# Patient Record
Sex: Female | Born: 1975 | Race: White | Hispanic: No | Marital: Single | Smoking: Never smoker
Health system: Southern US, Community
[De-identification: ages and names within clinical notes are randomized; demographics above are authoritative.]

## PROBLEM LIST (undated history)

## (undated) DIAGNOSIS — R112 Nausea with vomiting, unspecified: Secondary | ICD-10-CM

## (undated) DIAGNOSIS — N390 Urinary tract infection, site not specified: Secondary | ICD-10-CM

## (undated) DIAGNOSIS — F32A Depression, unspecified: Secondary | ICD-10-CM

## (undated) DIAGNOSIS — Z9889 Other specified postprocedural states: Secondary | ICD-10-CM

## (undated) DIAGNOSIS — N200 Calculus of kidney: Secondary | ICD-10-CM

## (undated) DIAGNOSIS — F329 Major depressive disorder, single episode, unspecified: Secondary | ICD-10-CM

## (undated) HISTORY — PX: LIPOSUCTION: SHX10

## (undated) HISTORY — PX: TUBAL LIGATION: SHX77

## (undated) HISTORY — PX: LITHOTRIPSY: SUR834

---

## 2004-04-27 ENCOUNTER — Inpatient Hospital Stay (HOSPITAL_COMMUNITY): Admission: AD | Admit: 2004-04-27 | Discharge: 2004-04-27 | Payer: Self-pay | Admitting: Obstetrics and Gynecology

## 2004-09-08 ENCOUNTER — Emergency Department (HOSPITAL_COMMUNITY): Admission: EM | Admit: 2004-09-08 | Discharge: 2004-09-09 | Payer: Self-pay | Admitting: Emergency Medicine

## 2009-06-04 ENCOUNTER — Emergency Department (HOSPITAL_BASED_OUTPATIENT_CLINIC_OR_DEPARTMENT_OTHER): Admission: EM | Admit: 2009-06-04 | Discharge: 2009-06-04 | Payer: Self-pay | Admitting: Emergency Medicine

## 2009-06-04 ENCOUNTER — Ambulatory Visit: Payer: Self-pay | Admitting: Diagnostic Radiology

## 2011-02-26 LAB — CBC
HCT: 36 % (ref 36.0–46.0)
MCHC: 34.8 g/dL (ref 30.0–36.0)
MCV: 97.1 fL (ref 78.0–100.0)
Platelets: 169 10*3/uL (ref 150–400)
RDW: 12.1 % (ref 11.5–15.5)
WBC: 7.5 10*3/uL (ref 4.0–10.5)

## 2011-02-26 LAB — URINALYSIS, ROUTINE W REFLEX MICROSCOPIC
Glucose, UA: NEGATIVE mg/dL
Ketones, ur: 15 mg/dL — AB
Nitrite: NEGATIVE
Specific Gravity, Urine: 1.023 (ref 1.005–1.030)
Urobilinogen, UA: 0.2 mg/dL (ref 0.0–1.0)

## 2011-02-26 LAB — DIFFERENTIAL
Basophils Absolute: 0 10*3/uL (ref 0.0–0.1)
Eosinophils Relative: 1 % (ref 0–5)
Lymphocytes Relative: 9 % — ABNORMAL LOW (ref 12–46)
Neutro Abs: 6.3 10*3/uL (ref 1.7–7.7)
Neutrophils Relative %: 84 % — ABNORMAL HIGH (ref 43–77)

## 2011-02-26 LAB — COMPREHENSIVE METABOLIC PANEL
AST: 18 U/L (ref 0–37)
BUN: 9 mg/dL (ref 6–23)
GFR calc non Af Amer: >60 mL/min (ref >60)
Potassium: 3.6 mEq/L (ref 3.5–5.1)
Sodium: 142 mEq/L (ref 135–145)

## 2011-02-26 LAB — WET PREP, GENITAL: Yeast Wet Prep HPF POC: NONE SEEN

## 2011-02-26 LAB — PREGNANCY, URINE: Preg Test, Ur: NEGATIVE

## 2011-02-26 LAB — GC/CHLAMYDIA PROBE AMP, GENITAL
Chlamydia, DNA Probe: NEGATIVE
GC Probe Amp, Genital: NEGATIVE

## 2011-05-21 ENCOUNTER — Emergency Department (HOSPITAL_BASED_OUTPATIENT_CLINIC_OR_DEPARTMENT_OTHER)
Admission: EM | Admit: 2011-05-21 | Discharge: 2011-05-21 | Disposition: A | Discharge status: Home / Self Care | Payer: 59 | Attending: Emergency Medicine | Admitting: Emergency Medicine

## 2011-05-21 ENCOUNTER — Emergency Department (INDEPENDENT_AMBULATORY_CARE_PROVIDER_SITE_OTHER): Payer: 59

## 2011-05-21 DIAGNOSIS — R1031 Right lower quadrant pain: Secondary | ICD-10-CM

## 2011-05-21 DIAGNOSIS — R109 Unspecified abdominal pain: Secondary | ICD-10-CM

## 2011-05-21 DIAGNOSIS — N200 Calculus of kidney: Secondary | ICD-10-CM | POA: Insufficient documentation

## 2011-05-21 DIAGNOSIS — R350 Frequency of micturition: Secondary | ICD-10-CM

## 2011-05-21 LAB — BASIC METABOLIC PANEL
Creatinine, Ser: 0.6 mg/dL (ref 0.50–1.10)
GFR calc Af Amer: >60 mL/min (ref >60)
GFR calc non Af Amer: >60 mL/min (ref >60)
Potassium: 3.8 mEq/L (ref 3.5–5.1)

## 2011-05-21 LAB — PREGNANCY, URINE: Preg Test, Ur: NEGATIVE

## 2011-05-21 LAB — URINALYSIS, ROUTINE W REFLEX MICROSCOPIC
Bilirubin Urine: NEGATIVE
Hgb urine dipstick: NEGATIVE
Ketones, ur: NEGATIVE mg/dL
Nitrite: NEGATIVE
Protein, ur: NEGATIVE mg/dL
Specific Gravity, Urine: 1.01 (ref 1.005–1.030)
Urobilinogen, UA: 0.2 mg/dL (ref 0.0–1.0)

## 2011-05-22 ENCOUNTER — Emergency Department (HOSPITAL_BASED_OUTPATIENT_CLINIC_OR_DEPARTMENT_OTHER)
Admission: EM | Admit: 2011-05-22 | Discharge: 2011-05-22 | Disposition: A | Discharge status: Home / Self Care | Payer: 59 | Attending: Emergency Medicine | Admitting: Emergency Medicine

## 2011-05-22 DIAGNOSIS — R109 Unspecified abdominal pain: Secondary | ICD-10-CM | POA: Insufficient documentation

## 2011-05-22 LAB — WET PREP, GENITAL
Clue Cells Wet Prep HPF POC: NONE SEEN
Trich, Wet Prep: NONE SEEN
Yeast Wet Prep HPF POC: NONE SEEN

## 2011-05-23 LAB — GC/CHLAMYDIA PROBE AMP, GENITAL: GC Probe Amp, Genital: NEGATIVE

## 2011-11-07 ENCOUNTER — Emergency Department (INDEPENDENT_AMBULATORY_CARE_PROVIDER_SITE_OTHER): Payer: Medicaid Other

## 2011-11-07 ENCOUNTER — Emergency Department (HOSPITAL_BASED_OUTPATIENT_CLINIC_OR_DEPARTMENT_OTHER)
Admission: EM | Admit: 2011-11-07 | Discharge: 2011-11-07 | Disposition: A | Discharge status: Home / Self Care | Payer: Medicaid Other | Attending: Emergency Medicine | Admitting: Emergency Medicine

## 2011-11-07 ENCOUNTER — Encounter: Payer: Self-pay | Admitting: *Deleted

## 2011-11-07 DIAGNOSIS — R112 Nausea with vomiting, unspecified: Secondary | ICD-10-CM | POA: Insufficient documentation

## 2011-11-07 DIAGNOSIS — R1031 Right lower quadrant pain: Secondary | ICD-10-CM | POA: Insufficient documentation

## 2011-11-07 DIAGNOSIS — N83209 Unspecified ovarian cyst, unspecified side: Secondary | ICD-10-CM | POA: Insufficient documentation

## 2011-11-07 DIAGNOSIS — N83201 Unspecified ovarian cyst, right side: Secondary | ICD-10-CM

## 2011-11-07 HISTORY — DX: Urinary tract infection, site not specified: N39.0

## 2011-11-07 HISTORY — DX: Depression, unspecified: F32.A

## 2011-11-07 HISTORY — DX: Calculus of kidney: N20.0

## 2011-11-07 HISTORY — DX: Major depressive disorder, single episode, unspecified: F32.9

## 2011-11-07 LAB — COMPREHENSIVE METABOLIC PANEL
BUN: 13 mg/dL (ref 6–23)
CO2: 25 mEq/L (ref 19–32)
Chloride: 108 mEq/L (ref 96–112)
Potassium: 3.9 mEq/L (ref 3.5–5.1)
Sodium: 141 mEq/L (ref 135–145)
Total Protein: 6.4 g/dL (ref 6.0–8.3)

## 2011-11-07 LAB — URINALYSIS, ROUTINE W REFLEX MICROSCOPIC
Glucose, UA: NEGATIVE mg/dL
Ketones, ur: 40 mg/dL — AB
Urobilinogen, UA: 0.2 mg/dL (ref 0.0–1.0)
pH: 7 (ref 5.0–8.0)

## 2011-11-07 LAB — PREGNANCY, URINE: Preg Test, Ur: NEGATIVE

## 2011-11-07 LAB — CBC
HCT: 35.8 % — ABNORMAL LOW (ref 36.0–46.0)
MCH: 33.4 pg (ref 26.0–34.0)
MCHC: 36 g/dL (ref 30.0–36.0)
Platelets: 148 10*3/uL — ABNORMAL LOW (ref 150–400)
RDW: 11.5 % (ref 11.5–15.5)

## 2011-11-07 MED ORDER — ONDANSETRON 8 MG PO TBDP
ORAL_TABLET | ORAL | Status: AC
  Filled 2011-11-07: qty 1

## 2011-11-07 MED ORDER — IOHEXOL 300 MG/ML  SOLN
100.0000 mL | Freq: Once | INTRAMUSCULAR | Status: AC | PRN
  Administered 2011-11-07: 100 mL via INTRAVENOUS

## 2011-11-07 MED ORDER — ONDANSETRON HCL 4 MG/2ML IJ SOLN
4.0000 mg | Freq: Once | INTRAMUSCULAR | Status: AC
  Administered 2011-11-07: 4 mg via INTRAVENOUS
  Filled 2011-11-07: qty 2

## 2011-11-07 MED ORDER — MORPHINE SULFATE 4 MG/ML IJ SOLN
4.0000 mg | Freq: Once | INTRAMUSCULAR | Status: AC
  Administered 2011-11-07: 4 mg via INTRAVENOUS
  Filled 2011-11-07: qty 1

## 2011-11-07 MED ORDER — ONDANSETRON 8 MG PO TBDP: 8.0000 mg | ORAL_TABLET | Freq: Three times a day (TID) | ORAL | Status: AC | PRN

## 2011-11-07 MED ORDER — MORPHINE SULFATE 4 MG/ML IJ SOLN
6.0000 mg | Freq: Once | INTRAMUSCULAR | Status: AC
  Administered 2011-11-07: 4 mg via INTRAVENOUS
  Filled 2011-11-07: qty 1

## 2011-11-07 MED ORDER — ONDANSETRON 8 MG PO TBDP
8.0000 mg | ORAL_TABLET | Freq: Once | ORAL | Status: AC
  Administered 2011-11-07: 8 mg via ORAL

## 2011-11-07 MED ORDER — MORPHINE SULFATE 2 MG/ML IJ SOLN
INTRAMUSCULAR | Status: AC
  Administered 2011-11-07: 6 mg via INTRAVENOUS
  Filled 2011-11-07: qty 1

## 2011-11-07 MED ORDER — HYDROCODONE-ACETAMINOPHEN 5-500 MG PO TABS: 1.0000 | ORAL_TABLET | Freq: Four times a day (QID) | ORAL | Status: AC | PRN

## 2011-11-07 MED ORDER — IBUPROFEN 600 MG PO TABS: 600.0000 mg | ORAL_TABLET | Freq: Three times a day (TID) | ORAL | Status: AC | PRN

## 2011-11-07 MED ORDER — SODIUM CHLORIDE 0.9 % IV BOLUS (SEPSIS)
1000.0000 mL | Freq: Once | INTRAVENOUS | Status: AC
  Administered 2011-11-07: 1000 mL via INTRAVENOUS

## 2011-11-07 NOTE — ED Provider Notes (Signed)
History     CSN: [id number] Arrival date & time: 11/07/2011 11:20 AM   First MD Initiated Contact with Patient 11/07/11 1119      Chief Complaint  Patient presents with  . Abdominal Pain    RLQ and right lower back pain    HPI Patient reports approximately 3 days of right-sided abdominal pain with nausea no vomiting.  She denies diarrhea.  She took a laxative last night without improvement in her symptoms.  However she has not had significant constipation.  She reports she's had these symptoms on and off for several years has been evaluated for this with both ultrasound as well as CT scans without a definitive cause.  She has seen a gastroenterologist for this as well and had sigmoidoscopy x2 without colonoscopy or endoscopy.  No definitive diagnosis has been made to this point.  She reports presenting today because of worsening right-sided pain.  She's had a tubal ligation.  She reports a recent ultrasound demonstrating no ovarian cysts several weeks ago.  Her OB/GYN is also aware of her symptoms and has not made a diagnosis of this point.  She's had no fever or chills.  Nothing worsens her symptoms.  Nothing improves her symptoms.  Her symptoms are constant and she reports radiation to the right flank some.  She does have a prior history of kidney stones without ureterolithiasis.   Past Medical History  Diagnosis Date  . Kidney stones   . Urinary tract infection     chronic   . Depression     Past Surgical History  Procedure Date  . Cesarean section   . Liposuction     with fat transfer  . Tubal ligation     No family history on file.  History  Substance Use Topics  . Smoking status: Not on file  . Smokeless tobacco: Not on file  . Alcohol Use: No    OB History    Grav Para Term Preterm Abortions TAB SAB Ect Mult Living                  Review of Systems  All other systems reviewed and are negative.    Allergies  Review of patient's allergies indicates no  known allergies.  Home Medications   Current Outpatient Rx  Name Route Sig Dispense Refill  . DOXYCYCLINE (ROSACEA) 40 MG PO CPDR Oral Take 40 mg by mouth every morning.      Marland Kitchen NAPROXEN 500 MG PO TABS Oral Take 500 mg by mouth 2 (two) times daily with a meal.      . MEDROXYPROGESTERONE ACETATE 10 MG PO TABS Oral Take 10 mg by mouth daily.        BP 123/78  Pulse 74  Temp(Src) 98.7 F (37.1 C) (Oral)  Resp 20  Ht 5\' 7"  (1.702 m)  Wt 140 lb (63.504 kg)  BMI 21.93 kg/m2  SpO2 100%  LMP 09/27/2011  Physical Exam  Nursing note and vitals reviewed. Constitutional: She is oriented to person, place, and time. She appears well-developed and well-nourished. No distress.  HENT:  Head: Normocephalic and atraumatic.  Eyes: EOM are normal.  Neck: Normal range of motion.  Cardiovascular: Normal rate, regular rhythm and normal heart sounds.   Pulmonary/Chest: Effort normal and breath sounds normal.  Abdominal: Soft. She exhibits no distension.       Mild right-sided tenderness without focal pain.  No pain in her right upper quadrant.  Negative Murphy's sign.  No tenderness  at McBurney's point  Genitourinary:       No CVA tenderness  Musculoskeletal: Normal range of motion.  Neurological: She is alert and oriented to person, place, and time.  Skin: Skin is warm and dry.  Psychiatric: She has a normal mood and affect. Judgment normal.    ED Course  Procedures (including critical care time)  Labs Reviewed  URINALYSIS, ROUTINE W REFLEX MICROSCOPIC - Abnormal; Notable for the following:    Ketones, ur 40 (*)    All other components within normal limits  CBC - Abnormal; Notable for the following:    WBC 3.6 (*)    RBC 3.86 (*)    HCT 35.8 (*)    Platelets 148 (*)    All other components within normal limits  COMPREHENSIVE METABOLIC PANEL - Abnormal; Notable for the following:    Alkaline Phosphatase 31 (*)    All other components within normal limits  PREGNANCY, URINE   Ct  Abdomen Pelvis W Contrast  11/07/2011  *RADIOLOGY REPORT*  Clinical Data: Right lower abdominal pain, back pain, nausea and vomiting.  CT ABDOMEN AND PELVIS WITH CONTRAST  Technique:  Multidetector CT imaging of the abdomen and pelvis was performed following the standard protocol during bolus administration of intravenous contrast.  Contrast: OMNIPAQUE IOHEXOL 300 MG/ML IV SOLN  Comparison: 05/21/2011 and 06/04/2009.  Findings: Large right adnexal cyst present not identified on prior studies and measuring approximately 4.5 cm in greatest diameter. This is likely a physiologic cyst.  Internal density consistent with simple fluid.  Trace free fluid present in the pelvis.  The appendix is normal and there is no evidence of acute appendicitis.  Other bowel loops in the abdomen and pelvis are unremarkable.  The liver, gallbladder, pancreas, spleen, adrenal glands and kidneys are within normal limits.  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  No hernias or abnormal calcifications.  Bony structures are unremarkable.  IMPRESSION: 4.5 cm right adnexal cyst.  Original Report Authenticated By: Reola Calkins, M.D.   I personally evaluated the CT scan  1. Abdominal pain   2. Right ovarian cyst       MDM  Will treat pain and give fluids at this time.  Pain medicine and antiemetics.  The patient has had several negative ultrasounds and CT scans.     1:12 PM The patient continues to have right-sided tenderness and appears to have a little bit more focal tenderness in the right lower quadrant.  CT scan pending at this time.  She reports her pain is improved after morphine.  Her last CT scan did demonstrate some mild swelling of her right ureter without ureterolithiasis.  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  2:29 PM The patient feels much better at this time.  She was discharged home with  anti-inflammatories and Vicodin pain medicine.  Her pain is likely related to right ovarian cyst in the she will be referred to OB/GYN for additional followup.  The patient understands and agreeable to outpatient plan       Lyanne Co, MD 11/07/11 1429

## 2011-11-07 NOTE — ED Notes (Signed)
Patient states she has had rlq abdominal pain and right lower back pain for the last 3 days.  States she has had the same symptoms off and on for the last year.  Was seen here for the same in July.  States pain started in her rlq and radiates into right lower back.  Denies urinary symptoms or vaginal drainage.  States she took a laxative last night to clean herself out with good results, states she was having regular bm's prior to the laxative.

## 2012-03-04 ENCOUNTER — Telehealth: Payer: Self-pay | Admitting: Obstetrics and Gynecology

## 2012-03-04 NOTE — Telephone Encounter (Signed)
B/c refill req/linda/ep

## 2012-03-05 NOTE — Telephone Encounter (Signed)
Tc to pt wanting rx for bc safyral. Informed pt that I will [facility] per ep and if she has any problems to give [place] a call back

## 2012-03-05 NOTE — Telephone Encounter (Signed)
Call in Safyral #1 1 po qd with 11 ref [facility] [address] per ep

## 2014-01-20 ENCOUNTER — Other Ambulatory Visit: Payer: Self-pay | Admitting: Family Medicine

## 2014-01-20 DIAGNOSIS — R1011 Right upper quadrant pain: Secondary | ICD-10-CM

## 2014-01-29 ENCOUNTER — Ambulatory Visit
Admission: RE | Admit: 2014-01-29 | Discharge: 2014-01-29 | Disposition: A | Discharge status: Home / Self Care | Payer: BC Managed Care – PPO | Source: Ambulatory Visit | Attending: Family Medicine | Admitting: Family Medicine

## 2014-01-29 DIAGNOSIS — R1011 Right upper quadrant pain: Secondary | ICD-10-CM

## 2014-02-25 ENCOUNTER — Other Ambulatory Visit: Payer: Self-pay | Admitting: Urology

## 2014-02-27 ENCOUNTER — Encounter (HOSPITAL_COMMUNITY): Payer: Self-pay | Admitting: *Deleted

## 2014-03-03 ENCOUNTER — Encounter (HOSPITAL_COMMUNITY): Payer: Self-pay | Admitting: Pharmacy Technician

## 2014-03-09 ENCOUNTER — Encounter (HOSPITAL_COMMUNITY): Payer: Self-pay | Admitting: General Practice

## 2014-03-09 ENCOUNTER — Ambulatory Visit (HOSPITAL_COMMUNITY): Payer: BC Managed Care – PPO

## 2014-03-09 ENCOUNTER — Encounter (HOSPITAL_COMMUNITY)
Admission: RE | Disposition: A | Discharge status: Home / Self Care | Payer: Self-pay | Source: Ambulatory Visit | Attending: Urology

## 2014-03-09 ENCOUNTER — Ambulatory Visit (HOSPITAL_COMMUNITY)
Admission: RE | Admit: 2014-03-09 | Discharge: 2014-03-09 | Disposition: A | Discharge status: Home / Self Care | Payer: BC Managed Care – PPO | Source: Ambulatory Visit | Attending: Urology | Admitting: Urology

## 2014-03-09 DIAGNOSIS — N200 Calculus of kidney: Secondary | ICD-10-CM | POA: Insufficient documentation

## 2014-03-09 DIAGNOSIS — Z79899 Other long term (current) drug therapy: Secondary | ICD-10-CM | POA: Insufficient documentation

## 2014-03-09 DIAGNOSIS — Z791 Long term (current) use of non-steroidal anti-inflammatories (NSAID): Secondary | ICD-10-CM | POA: Insufficient documentation

## 2014-03-09 SURGERY — LITHOTRIPSY, ESWL
Anesthesia: LOCAL | Laterality: Right

## 2014-03-09 MED ORDER — DIAZEPAM 5 MG PO TABS
10.0000 mg | ORAL_TABLET | ORAL | Status: AC
  Administered 2014-03-09: 10 mg via ORAL
  Filled 2014-03-09: qty 2

## 2014-03-09 MED ORDER — TAMSULOSIN HCL 0.4 MG PO CAPS: 0.4000 mg | ORAL_CAPSULE | Freq: Every day | ORAL | Status: DC

## 2014-03-09 MED ORDER — CIPROFLOXACIN HCL 500 MG PO TABS: 500.0000 mg | ORAL_TABLET | Freq: Two times a day (BID) | ORAL | Status: DC

## 2014-03-09 MED ORDER — CIPROFLOXACIN HCL 500 MG PO TABS
500.0000 mg | ORAL_TABLET | ORAL | Status: AC
  Administered 2014-03-09: 500 mg via ORAL
  Filled 2014-03-09: qty 1

## 2014-03-09 MED ORDER — DIPHENHYDRAMINE HCL 25 MG PO CAPS
25.0000 mg | ORAL_CAPSULE | ORAL | Status: AC
  Administered 2014-03-09: 25 mg via ORAL
  Filled 2014-03-09: qty 1

## 2014-03-09 MED ORDER — SODIUM CHLORIDE 0.9 % IV SOLN
INTRAVENOUS | Status: DC
  Administered 2014-03-09: 11:00:00 via INTRAVENOUS

## 2014-03-09 MED ORDER — IBUPROFEN 200 MG PO TABS: 800.0000 mg | ORAL_TABLET | Freq: Four times a day (QID) | ORAL | Status: DC | PRN

## 2014-03-09 MED ORDER — OXYCODONE-ACETAMINOPHEN 5-325 MG PO TABS: 1.0000 | ORAL_TABLET | ORAL | Status: DC | PRN

## 2014-03-09 MED ORDER — DICLOFENAC SODIUM 75 MG PO TBEC: 75.0000 mg | DELAYED_RELEASE_TABLET | Freq: Two times a day (BID) | ORAL | Status: DC | PRN

## 2014-03-09 NOTE — H&P (Signed)
History of Present Illness Consult for nephrolithiasis referred by Dr. Mila PalmerSharon Kerr.     1- nephrolithiasis - Mar 2015 - pt developed right flank pain radiating down into the right lower quadrant about 3 months ago. She thought it was her "ovary". Pain is mild to moderate and intermittent. She hasn't had a lot of pain over the past few days. A Mar 2015 Abd U/s showed a right 9mm upj stone and a 5 mm LMP stone. I reviewed all the images. She also had a CT scan December 2012 which showed a 9 mm right lower pole stone. I could see the stone on the scout. I did not appreciate the left renal stone. Again all images reviewed. Bun 13, cr 0.73, UA clear.   She is known about the stones but has had no prior stone passage or stone treatment.    Past Medical History Problems  1. History of Anxiety (300.00) 2. History of renal calculi (V13.01)  Surgical History Problems  1. History of Cesarean Section 2. History of Liposuction 3. History of Tubal Ligation  Current Meds 1. Diclofenac Sodium 75 MG Oral Tablet Delayed Release;  Therapy: (Recorded:25Mar2015) to Recorded 2. Probiotic CAPS;  Therapy: (Recorded:25Mar2015) to Recorded 3. Zyrtec TABS;  Therapy: (Recorded:25Mar2015) to Recorded  Allergies Medication  1. No Known Drug Allergies  Social History Problems    Daily caffeine consumption, 1 serving a day   Minimum alcohol consumption   Never smoker   Single   Three children  Review of Systems Genitourinary, constitutional, skin, eye, otolaryngeal, hematologic/lymphatic, cardiovascular, pulmonary, endocrine, musculoskeletal, gastrointestinal, neurological and psychiatric system(s) were reviewed and pertinent findings if present are noted.  Gastrointestinal: nausea, heartburn, diarrhea and constipation.  Constitutional: fever, night sweats, feeling tired (fatigue) and recent weight loss.  Hematologic/Lymphatic: a tendency to easily bruise.  Psychiatric: anxiety.     Vitals Vital Signs [Data Includes: Last 1 Day]  Recorded: 25Mar2015 01:24PM  Height: 5 ft 7 in Weight: 143 lb  BMI Calculated: 22.4 BSA Calculated: 1.75 Blood Pressure: 104 / 69 Temperature: 98 F Heart Rate: 56  Physical Exam Constitutional: Well nourished and well developed . No acute distress.  Pulmonary: No respiratory distress and normal respiratory rhythm and effort.  Cardiovascular: Heart rate and rhythm are normal . No peripheral edema.  Abdomen: No CVA tenderness.  Neuro/Psych:. Mood and affect are appropriate.    Results/Data Urine [Data Includes: Last 1 Day]   25Mar2015  COLOR YELLOW   APPEARANCE CLOUDY   SPECIFIC GRAVITY 1.020   pH 6.5   GLUCOSE NEG mg/dL  BILIRUBIN NEG   KETONE NEG mg/dL  BLOOD NEG   PROTEIN NEG mg/dL  UROBILINOGEN 0.2 mg/dL  NITRITE NEG   LEUKOCYTE ESTERASE NEG   SQUAMOUS EPITHELIAL/HPF FEW   WBC NONE SEEN WBC/hpf  RBC NONE SEEN RBC/hpf  BACTERIA NONE SEEN   CRYSTALS NONE SEEN   CASTS NONE SEEN   Other AMORPHOUS NOTED    Procedure KUB - stable 9 mm right lower pole stone. Stable pelvic phlebolith on the right. I did not appreciate the left stone. Normal glans and bowel gas pattern. The colon was obscuring the left kidney.     Assessment Assessed  1. Nephrolithiasis (592.0)  Plan Health Maintenance  1. UA With REFLEX; [Do Not Release]; Status:Complete;   Done: 25Mar2015 01:15PM Nephrolithiasis  2. Follow-up Schedule Surgery Office  Follow-up  Status: Hold For - Appointment   Requested for: 25Mar2015 3. KUB; Status:Resulted - Requires Verification;   Done: 25Mar2015 12:00AM  Discussion/Summary I discussed the CT and KUB findings with the patient. Whenever the nature risks and benefits of continued surveillance, RIGHT shockwave lithotripsy, or ureteroscopy. All questions answered. She would like to proceed with shockwave lithotripsy and we discussed risks such as bleeding and infection failure to fragment failure to pass among  others. We discussed she may need a staged procedure to clear this stone.      Signatures Electronically signed by : Jerilee FieldMatthew Deshan Hemmelgarn, M.D.; Feb 11 2014  2:40PM EST

## 2014-03-09 NOTE — Op Note (Signed)
See [facility] op note

## 2014-03-09 NOTE — Discharge Instructions (Signed)
Lithotripsy, Care After °Refer to this sheet in the next few weeks. These instructions provide you with information on caring for yourself after your procedure. Your health care provider may also give you more specific instructions. Your treatment has been planned according to current medical practices, but problems sometimes occur. Call your health care provider if you have any problems or questions after your procedure. °WHAT TO EXPECT AFTER THE PROCEDURE  °· Your urine may have a red tinge for a few days after treatment. Blood loss is usually minimal. °· You may have soreness in the back or flank area. This usually goes away after a few days. The procedure can cause blotches or bruises on the back where the pressure wave enters the skin. These marks usually cause only minimal discomfort and should disappear in a short time. °· Stone fragments should begin to pass within 24 hours of treatment. However, a delayed passage is not unusual. °· You may have pain, discomfort, and feel sick to your stomach (nauseated) when the crushed fragments of stone are passed down the tube from the kidney to the bladder. Stone fragments can pass soon after the procedure and may last for up to 4 8 weeks. °· A small number of patients may have severe pain when stone fragments are not able to pass, which leads to an obstruction. °· If your stone is greater than 1 inch (2.5 cm) in diameter or if you have multiple stones that have a combined diameter greater than 1 inch (2.5 cm), you may require more than one treatment. °· If you had a stent placed prior to your procedure, you may experience some discomfort, especially during urination. You may experience the pain or discomfort in your flank or back, or you may experience a sharp pain or discomfort at the base of your penis or in your lower abdomen. The discomfort usually lasts only a few minutes after urinating. °HOME CARE INSTRUCTIONS  °· Rest at home until you feel your energy  improving. °· Only take over-the-counter or prescription medicines for pain, discomfort, or fever as directed by your health care provider. Depending on the type of lithotripsy, you may need to take antibiotics and anti-inflammatory medicines for a few days. °· Drink enough water and fluids to keep your urine clear or pale yellow. This helps "flush" your kidneys. It helps pass any remaining pieces of stone and prevents stones from coming back. °· Most people can resume daily activities within 1 2 days after standard lithotripsy. It can take longer to recover from laser and percutaneous lithotripsy. °· If the stones are in your urinary system, you may be asked to strain your urine at home to look for stones. Any stones that are found can be sent to a medical lab for examination. °· Visit your health care provider for a follow-up appointment in a few weeks. Your doctor may remove your stent if you have one. Your health care provider will also check to see whether stone particles still remain. °SEEK MEDICAL CARE IF:  °· Your pain is not relieved by medicine. °· You have a lasting nauseous feeling. °· You feel there is too much blood in the urine. °· You develop persistent problems with frequent or painful urination that does not at least partially improve after 2 days following the procedure. °· You have a congested cough. °· You feel lightheaded. °· You develop a rash or any other signs that might suggest an allergic problem. °· You develop any reaction or side   effects to your medicine(s). °SEEK IMMEDIATE MEDICAL CARE IF:  °· You experience severe back or flank pain or both. °· You see nothing but blood when you urinate. °· You cannot pass any urine at all. °· You have a fever or shaking chills. °· You develop shortness of breath, difficulty breathing, or chest pain. °· You develop vomiting that will not stop after 6 8 hours. °· You have a fainting episode. °Document Released: 11/26/2007 Document Revised: 08/27/2013  Document Reviewed: 05/22/2013 °[employer]® Patient Information ©2014 [employer]. ° °

## 2014-03-09 NOTE — Interval H&P Note (Signed)
History and Physical Interval Note:  03/09/2014 12:48 PM  Evelyn KluverKimberly A Kerr  has presented today for surgery, with the diagnosis of Right Renal Stone  The various methods of treatment have been discussed with the patient and family. After consideration of risks, benefits and other options for treatment, the patient has consented to  Procedure(s): RIGHT EXTRACORPOREAL SHOCK WAVE LITHOTRIPSY (ESWL) (Right) as a surgical intervention .  The patient's history has been reviewed, patient examined, no change in status, stable for surgery.  I have reviewed the patient's chart and labs.  Questions were answered to the patient's satisfaction.  She hasn't passed a stone. No dysuria or fever.    Lowella PettiesMatthew Ramsey Taffy Delconte

## 2016-02-25 DIAGNOSIS — F4310 Post-traumatic stress disorder, unspecified: Secondary | ICD-10-CM | POA: Diagnosis not present

## 2016-03-15 DIAGNOSIS — F4310 Post-traumatic stress disorder, unspecified: Secondary | ICD-10-CM | POA: Diagnosis not present

## 2016-03-24 DIAGNOSIS — F4310 Post-traumatic stress disorder, unspecified: Secondary | ICD-10-CM | POA: Diagnosis not present

## 2016-04-11 DIAGNOSIS — F4310 Post-traumatic stress disorder, unspecified: Secondary | ICD-10-CM | POA: Diagnosis not present

## 2016-04-21 DIAGNOSIS — F4310 Post-traumatic stress disorder, unspecified: Secondary | ICD-10-CM | POA: Diagnosis not present

## 2016-05-08 DIAGNOSIS — L509 Urticaria, unspecified: Secondary | ICD-10-CM | POA: Diagnosis not present

## 2016-05-19 DIAGNOSIS — F4310 Post-traumatic stress disorder, unspecified: Secondary | ICD-10-CM | POA: Diagnosis not present

## 2016-05-26 DIAGNOSIS — D72819 Decreased white blood cell count, unspecified: Secondary | ICD-10-CM | POA: Diagnosis not present

## 2016-06-02 ENCOUNTER — Telehealth: Payer: Self-pay | Admitting: Hematology and Oncology

## 2016-06-02 ENCOUNTER — Encounter: Payer: Self-pay | Admitting: Hematology and Oncology

## 2016-06-02 DIAGNOSIS — F4310 Post-traumatic stress disorder, unspecified: Secondary | ICD-10-CM | POA: Diagnosis not present

## 2016-06-02 NOTE — Telephone Encounter (Signed)
Appointment with Evelyn Kerr on 8/1 at 11:30am. Aware to arrive 30 minutes early. Demographics verified, Insurance will change on 8/1. Location given. Letter to referring.

## 2016-06-08 DIAGNOSIS — Z113 Encounter for screening for infections with a predominantly sexual mode of transmission: Secondary | ICD-10-CM | POA: Diagnosis not present

## 2016-06-08 DIAGNOSIS — D729 Disorder of white blood cells, unspecified: Secondary | ICD-10-CM | POA: Diagnosis not present

## 2016-06-16 DIAGNOSIS — F4310 Post-traumatic stress disorder, unspecified: Secondary | ICD-10-CM | POA: Diagnosis not present

## 2016-06-20 ENCOUNTER — Ambulatory Visit: Payer: BLUE CROSS/BLUE SHIELD | Admitting: Hematology and Oncology

## 2016-07-14 DIAGNOSIS — F4310 Post-traumatic stress disorder, unspecified: Secondary | ICD-10-CM | POA: Diagnosis not present

## 2016-07-28 DIAGNOSIS — F4310 Post-traumatic stress disorder, unspecified: Secondary | ICD-10-CM | POA: Diagnosis not present

## 2016-08-11 DIAGNOSIS — F4310 Post-traumatic stress disorder, unspecified: Secondary | ICD-10-CM | POA: Diagnosis not present

## 2016-08-28 DIAGNOSIS — F4310 Post-traumatic stress disorder, unspecified: Secondary | ICD-10-CM | POA: Diagnosis not present

## 2016-08-31 ENCOUNTER — Emergency Department (HOSPITAL_COMMUNITY)
Admission: EM | Admit: 2016-08-31 | Discharge: 2016-08-31 | Disposition: A | Discharge status: Home / Self Care | Payer: BLUE CROSS/BLUE SHIELD | Attending: Emergency Medicine | Admitting: Emergency Medicine

## 2016-08-31 ENCOUNTER — Emergency Department (HOSPITAL_COMMUNITY): Payer: BLUE CROSS/BLUE SHIELD

## 2016-08-31 ENCOUNTER — Encounter (HOSPITAL_COMMUNITY): Payer: Self-pay | Admitting: Family Medicine

## 2016-08-31 DIAGNOSIS — R102 Pelvic and perineal pain: Secondary | ICD-10-CM | POA: Insufficient documentation

## 2016-08-31 DIAGNOSIS — N83201 Unspecified ovarian cyst, right side: Secondary | ICD-10-CM | POA: Diagnosis not present

## 2016-08-31 DIAGNOSIS — R1031 Right lower quadrant pain: Secondary | ICD-10-CM | POA: Diagnosis not present

## 2016-08-31 DIAGNOSIS — N83209 Unspecified ovarian cyst, unspecified side: Secondary | ICD-10-CM

## 2016-08-31 DIAGNOSIS — N83291 Other ovarian cyst, right side: Secondary | ICD-10-CM | POA: Diagnosis not present

## 2016-08-31 DIAGNOSIS — N83202 Unspecified ovarian cyst, left side: Secondary | ICD-10-CM | POA: Diagnosis not present

## 2016-08-31 HISTORY — DX: Other specified postprocedural states: Z98.890

## 2016-08-31 HISTORY — DX: Nausea with vomiting, unspecified: R11.2

## 2016-08-31 LAB — WET PREP, GENITAL
CLUE CELLS WET PREP: NONE SEEN
SPERM: NONE SEEN
TRICH WET PREP: NONE SEEN
YEAST WET PREP: NONE SEEN

## 2016-08-31 LAB — COMPREHENSIVE METABOLIC PANEL
ALBUMIN: 4.3 g/dL (ref 3.5–5.0)
ALT: 24 U/L (ref 14–54)
ANION GAP: 7 (ref 5–15)
AST: 54 U/L — AB (ref 15–41)
Alkaline Phosphatase: 47 U/L (ref 38–126)
BILIRUBIN TOTAL: 1.2 mg/dL (ref 0.3–1.2)
BUN: 10 mg/dL (ref 6–20)
CHLORIDE: 106 mmol/L (ref 101–111)
CO2: 26 mmol/L (ref 22–32)
Calcium: 9.4 mg/dL (ref 8.9–10.3)
Creatinine, Ser: 0.76 mg/dL (ref 0.44–1.00)
GFR calc Af Amer: >60 mL/min (ref >60)
GLUCOSE: 88 mg/dL (ref 65–99)
POTASSIUM: 4.9 mmol/L (ref 3.5–5.1)
Sodium: 139 mmol/L (ref 135–145)
TOTAL PROTEIN: 6.5 g/dL (ref 6.5–8.1)

## 2016-08-31 LAB — CBC
HEMATOCRIT: 41.1 % (ref 36.0–46.0)
HEMOGLOBIN: 14.3 g/dL (ref 12.0–15.0)
MCH: 32.3 pg (ref 26.0–34.0)
MCHC: 34.8 g/dL (ref 30.0–36.0)
MCV: 92.8 fL (ref 78.0–100.0)
Platelets: 154 10*3/uL (ref 150–400)
RBC: 4.43 MIL/uL (ref 3.87–5.11)
RDW: 12.2 % (ref 11.5–15.5)
WBC: 7 10*3/uL (ref 4.0–10.5)

## 2016-08-31 LAB — LIPASE, BLOOD: LIPASE: 28 U/L (ref 11–51)

## 2016-08-31 LAB — URINALYSIS, ROUTINE W REFLEX MICROSCOPIC
Bilirubin Urine: NEGATIVE
GLUCOSE, UA: NEGATIVE mg/dL
Hgb urine dipstick: NEGATIVE
Ketones, ur: NEGATIVE mg/dL
LEUKOCYTES UA: NEGATIVE
NITRITE: NEGATIVE
PH: 7 (ref 5.0–8.0)
Protein, ur: NEGATIVE mg/dL
SPECIFIC GRAVITY, URINE: 1.009 (ref 1.005–1.030)

## 2016-08-31 LAB — POC URINE PREG, ED: Preg Test, Ur: NEGATIVE

## 2016-08-31 LAB — HCG, QUANTITATIVE, PREGNANCY

## 2016-08-31 MED ORDER — IOPAMIDOL (ISOVUE-300) INJECTION 61%
INTRAVENOUS | Status: AC
  Administered 2016-08-31: 100 mL
  Filled 2016-08-31: qty 100

## 2016-08-31 MED ORDER — ONDANSETRON HCL 4 MG/2ML IJ SOLN
4.0000 mg | Freq: Once | INTRAMUSCULAR | Status: AC
  Administered 2016-08-31: 4 mg via INTRAVENOUS
  Filled 2016-08-31: qty 2

## 2016-08-31 MED ORDER — NAPROXEN 500 MG PO TABS
500.0000 mg | ORAL_TABLET | Freq: Two times a day (BID) | ORAL | 0 refills | Status: DC
Start: 2016-08-31 — End: 2016-08-31

## 2016-08-31 MED ORDER — HYDROCODONE-ACETAMINOPHEN 5-325 MG PO TABS: 1.0000 | ORAL_TABLET | Freq: Four times a day (QID) | ORAL | 0 refills | Status: DC | PRN

## 2016-08-31 MED ORDER — NAPROXEN 500 MG PO TABS: 500.0000 mg | ORAL_TABLET | Freq: Two times a day (BID) | ORAL | 0 refills | Status: DC

## 2016-08-31 MED ORDER — KETOROLAC TROMETHAMINE 30 MG/ML IJ SOLN
30.0000 mg | Freq: Once | INTRAMUSCULAR | Status: AC
  Administered 2016-08-31: 30 mg via INTRAVENOUS
  Filled 2016-08-31: qty 1

## 2016-08-31 NOTE — ED Triage Notes (Signed)
[facility] - was sent for eval for appendicitis.  Pain began 3 days ago as periumbilical, and is now RLQ. Denies NVDC, endorses decreased appetite

## 2016-08-31 NOTE — ED Triage Notes (Signed)
PT reports having kidney stones before and was treated with Lithotripsy.

## 2016-08-31 NOTE — ED Notes (Signed)
Pt to CT at this time.

## 2016-08-31 NOTE — Discharge Instructions (Signed)
Call your doctor tomorrow and schedule a follow up appointment.

## 2016-08-31 NOTE — ED Notes (Signed)
Pt taken to [facility]

## 2016-08-31 NOTE — ED Provider Notes (Signed)
[facility]-EMERGENCY DEPT Provider Note   CSN: [id number] Arrival date & time: 08/31/16  1309     History   Chief Complaint Chief Complaint  Patient presents with  . Abdominal Pain    HPI Caprice KluverKimberly A Michna is a 40 y.o. female with hx of Kidney stones, depression and UTI presents to the ED with lower abdominal pain that started 4 day ago. The pain started as generalized abdominal pain and today has localized to the RLQ. Patient saw her PCP today and was sent to the ED to evaluate for possible appendicitis.  Patient is sexually active with new partner x 2 weeks and has had unprotected sex. BTL for birth control.   The history is provided by the patient. No language interpreter was used.  Abdominal Pain   This is a new problem. The current episode started more than 2 days ago. The problem occurs constantly. The problem has been gradually worsening. The pain is located in the RLQ. The pain is at a severity of 6/10. Pertinent negatives include fever, nausea, vomiting, dysuria, frequency and headaches. Nothing aggravates the symptoms. Nothing relieves the symptoms.    Past Medical History:  Diagnosis Date  . Depression   . Kidney stones   . Post-operative nausea and vomiting   . Urinary tract infection    chronic     There are no active problems to display for this patient.   Past Surgical History:  Procedure Laterality Date  . CESAREAN SECTION    . LIPOSUCTION     with fat transfer  . TUBAL LIGATION      OB History    No data available       Home Medications    Prior to Admission medications   Medication Sig Start Date End Date Taking? Authorizing Provider  acidophilus (RISAQUAD) CAPS capsule Take 1 capsule by mouth daily.    Yes Historical Provider, MD  Ascorbic Acid (VITAMIN C) 100 MG tablet Take 100 mg by mouth daily.   Yes Historical Provider, MD  b complex vitamins tablet Take 1 tablet by mouth daily.   Yes Historical Provider, MD  cetirizine (ZYRTEC) 10 MG tablet  Take 10 mg by mouth daily.   Yes Historical Provider, MD  cholecalciferol (VITAMIN D) 1000 units tablet Take 1,000 Units by mouth daily.   Yes Historical Provider, MD  ibuprofen (ADVIL,MOTRIN) 200 MG tablet Take 4 tablets (800 mg total) by mouth every 6 (six) hours as needed for mild pain or cramping. 03/13/14  Yes Jerilee FieldMatthew Eskridge, MD  VITAMIN K PO Take 1 tablet by mouth daily.   Yes Historical Provider, MD  naproxen (NAPROSYN) 500 MG tablet Take 1 tablet (500 mg total) by mouth 2 (two) times daily. 08/31/16   Jezebel Pollet Orlene OchM Keniel Ralston, NP    Family History No family history on file.  Social History Social History  Substance Use Topics  . Smoking status: Never Smoker  . Smokeless tobacco: Never Used  . Alcohol use No     Allergies   Other   Review of Systems Review of Systems  Constitutional: Positive for appetite change. Negative for fever.  HENT: Negative.   Eyes: Negative for visual disturbance.  Respiratory: Negative for shortness of breath.   Cardiovascular: Negative for chest pain.  Gastrointestinal: Positive for abdominal pain. Negative for nausea and vomiting.  Genitourinary: Negative for dysuria, frequency, pelvic pain, urgency, vaginal bleeding and vaginal discharge.  Musculoskeletal: Negative for back pain.  Skin: Negative for rash.  Neurological: Negative for syncope,  light-headedness and headaches.  Psychiatric/Behavioral: Negative for confusion. The patient is not nervous/anxious.      Physical Exam Updated Vital Signs BP 106/71 (BP Location: Right Arm)   Pulse 68   Temp 98.5 F (36.9 C)   Resp 18   Ht 5\' 7"  (1.702 m)   Wt 69.9 kg   LMP 08/08/2016   SpO2 100%   BMI 24.12 kg/m   Physical Exam  Constitutional: She is oriented to person, place, and time. She appears well-developed and well-nourished. No distress.  HENT:  Head: Normocephalic.  Eyes: EOM are normal.  Neck: Normal range of motion. Neck supple.  Cardiovascular: Normal rate and regular rhythm.     Pulmonary/Chest: Effort normal and breath sounds normal.  Abdominal: Soft. Normal appearance and bowel sounds are normal. There is tenderness in the right lower quadrant. There is rebound. There is no rigidity, no guarding and no CVA tenderness.  Genitourinary:  Genitourinary Comments: External genitalia without lesions, yellow d/c vaginal vault. Mild CMT, right adnexal tenderness, uterus without palpable enlargement.   Musculoskeletal: Normal range of motion.  Neurological: She is alert and oriented to person, place, and time.  Psychiatric: She has a normal mood and affect. Her behavior is normal.     ED Treatments / Results  Labs (all labs ordered are listed, but only abnormal results are displayed) Labs Reviewed  WET PREP, GENITAL - Abnormal; Notable for the following:       Result Value   WBC, Wet Prep HPF POC MODERATE (*)    All other components within normal limits  COMPREHENSIVE METABOLIC PANEL - Abnormal; Notable for the following:    AST 54 (*)    All other components within normal limits  LIPASE, BLOOD  CBC  URINALYSIS, ROUTINE W REFLEX MICROSCOPIC (NOT AT [facility])  HCG, QUANTITATIVE, PREGNANCY  I-STAT BETA HCG BLOOD, ED ([facility], [facility], [facility] ONLY)  POC URINE PREG, ED  GC/CHLAMYDIA PROBE AMP ([place]) NOT AT [facility]    Radiology US Transvaginal Non-ob  Result Date: 08/31/2016 CLINICAL DATA:  Pelvic pain EXAM: TRANSABDOMINAL AND TRANSVAGINAL ULTRASOUND OF PELVIS TECHNIQUE: Both transabdominal and transvaginal ultrasound examinations of the pelvis were performed. Transabdominal technique was performed for global imaging of the pelvis including uterus, ovaries, adnexal regions, and pelvic cul-de-sac. It was necessary to proceed with endovaginal exam following the transabdominal exam to visualize the endometrium and ovaries. COMPARISON:  None FINDINGS: Uterus Measurements: 9 x 4.9 x 5.4 cm. No fibroids or other mass visualized. Endometrium Thickness: 16.3 mm.  No focal abnormality  visualized. Right ovary Measurements: 3.6 x 3 x 2.7 cm. 2.5 x 2.1 x 2.1 cm complex hypoechoic right ovarian mass without internal Doppler flow. Left ovary Measurements: 2.9 x 2.2 x 3 cm. 2.2 x 2 x 2.3 cm complex hypoechoic right ovarian mass with thin internal septations, but no internal Doppler flow. Other findings Small amount pelvic free fluid. Tubular bilateral hypoechoic structures likely reflecting hydrosalpinx. IMPRESSION: 1. Bilateral hypoechoic ovarian masses likely reflecting hemorrhagic cysts or endometriomas. Short-interval follow up ultrasound in 6-12 weeks is recommended, preferably during the week following the patient's normal menses. Electronically Signed   By: Elige Ko   On: 08/31/2016 21:40   US Pelvis Complete  Result Date: 08/31/2016 CLINICAL DATA:  Pelvic pain EXAM: TRANSABDOMINAL AND TRANSVAGINAL ULTRASOUND OF PELVIS TECHNIQUE: Both transabdominal and transvaginal ultrasound examinations of the pelvis were performed. Transabdominal technique was performed for global imaging of the pelvis including uterus, ovaries, adnexal regions, and pelvic cul-de-sac. It was necessary  to proceed with endovaginal exam following the transabdominal exam to visualize the endometrium and ovaries. COMPARISON:  None FINDINGS: Uterus Measurements: 9 x 4.9 x 5.4 cm. No fibroids or other mass visualized. Endometrium Thickness: 16.3 mm.  No focal abnormality visualized. Right ovary Measurements: 3.6 x 3 x 2.7 cm. 2.5 x 2.1 x 2.1 cm complex hypoechoic right ovarian mass without internal Doppler flow. Left ovary Measurements: 2.9 x 2.2 x 3 cm. 2.2 x 2 x 2.3 cm complex hypoechoic right ovarian mass with thin internal septations, but no internal Doppler flow. Other findings Small amount pelvic free fluid. Tubular bilateral hypoechoic structures likely reflecting hydrosalpinx. IMPRESSION: 1. Bilateral hypoechoic ovarian masses likely reflecting hemorrhagic cysts or endometriomas. Short-interval follow up ultrasound  in 6-12 weeks is recommended, preferably during the week following the patient's normal menses. Electronically Signed   By: Elige Ko   On: 08/31/2016 21:40   Ct Abdomen Pelvis W Contrast  Result Date: 08/31/2016 CLINICAL DATA:  Right lower quadrant pain.  Increasing pain. EXAM: CT ABDOMEN AND PELVIS WITH CONTRAST TECHNIQUE: Multidetector CT imaging of the abdomen and pelvis was performed using the standard protocol following bolus administration of intravenous contrast. CONTRAST:  ISOVUE-300 IOPAMIDOL (ISOVUE-300) INJECTION 61% COMPARISON:  None. FINDINGS: Lower chest: No acute abnormality. Hepatobiliary: No focal liver abnormality is seen. No gallstones, gallbladder wall thickening, or biliary dilatation. Pancreas: Unremarkable. No pancreatic ductal dilatation or surrounding inflammatory changes. Spleen: Normal in size without focal abnormality. Adrenals/Urinary Tract: Adrenal glands are unremarkable. Kidneys are normal, without renal calculi, focal lesion, or hydronephrosis. Duplicated right renal collecting system. Bladder is unremarkable. Stomach/Bowel: Stomach is within normal limits. Appendix appears normal. No evidence of bowel wall thickening, distention, or inflammatory changes. Vascular/Lymphatic: No significant vascular findings are present. No enlarged abdominal or pelvic lymph nodes. Reproductive: Tubular enhancing structure in the right adnexa most concerning or dilated fallopian tubes with surrounding inflammatory changes. Tubular peripheral enhancing structure in the left adnexal region concerning for a mildly dilated left fallopian tube. Small amount of pelvic free fluid. 3.7 cm cystic structure in the left adnexa with a thin internal septation likely reflecting a mildly complicated cyst. Other: No abdominal wall hernia or abnormality. Musculoskeletal: No acute or significant osseous findings. IMPRESSION: 1. Findings concerning for bilateral tubo-ovarian abscesses. Electronically  Signed   By: Elige Ko   On: 08/31/2016 18:52    Procedures Procedures (including critical care time)  Medications Ordered in ED Medications  ondansetron (ZOFRAN) injection 4 mg (4 mg Intravenous Given 08/31/16 1619)  iopamidol (ISOVUE-300) 61 % injection (100 mLs  Contrast Given 08/31/16 1615)  ketorolac (TORADOL) 30 MG/ML injection 30 mg (30 mg Intravenous Given 08/31/16 2200)     Initial Impression / Assessment and Plan / ED Course  I have reviewed the triage vital signs and the nursing notes.  Pertinent labs & imaging results that were available during my care of the patient were reviewed by me and considered in my medical decision making (see chart for details).  Clinical Course  Consult with Dr. Gust Rung at [facility] and he request patient have ultrasound for further evaluation.   Discussed results of ultrasound with Dr. Gust Rung and he suggest pain management and f/u for repeat ultrasound in 4 to 6 weeks.   Final Clinical Impressions(s) / ED Diagnoses  40 y.o. female with lower abdominal pain stable for d/c without acute abdomen. She will f/u with her PCP or go to [facility] if symptoms worsen.  Final diagnoses:  Pelvic pain in female  Hemorrhagic ovarian cyst    New Prescriptions Current Discharge Medication List    START taking these medications   Details  naproxen (NAPROSYN) 500 MG tablet Take 1 tablet (500 mg total) by mouth 2 (two) times daily. Qty: 20 tablet, Refills: [address] [place] 08/31/16 2207    Azalia Bilis, MD 08/31/16 2228

## 2016-09-01 LAB — GC/CHLAMYDIA PROBE AMP (~~LOC~~) NOT AT ARMC
Chlamydia: NEGATIVE
Neisseria Gonorrhea: NEGATIVE

## 2016-09-03 DIAGNOSIS — S91312A Laceration without foreign body, left foot, initial encounter: Secondary | ICD-10-CM | POA: Diagnosis not present

## 2016-09-22 DIAGNOSIS — F4310 Post-traumatic stress disorder, unspecified: Secondary | ICD-10-CM | POA: Diagnosis not present

## 2016-10-06 DIAGNOSIS — F4310 Post-traumatic stress disorder, unspecified: Secondary | ICD-10-CM | POA: Diagnosis not present

## 2016-10-20 DIAGNOSIS — F4310 Post-traumatic stress disorder, unspecified: Secondary | ICD-10-CM | POA: Diagnosis not present

## 2016-10-25 DIAGNOSIS — F4310 Post-traumatic stress disorder, unspecified: Secondary | ICD-10-CM | POA: Diagnosis not present

## 2016-11-08 DIAGNOSIS — F4310 Post-traumatic stress disorder, unspecified: Secondary | ICD-10-CM | POA: Diagnosis not present

## 2016-11-24 DIAGNOSIS — F4310 Post-traumatic stress disorder, unspecified: Secondary | ICD-10-CM | POA: Diagnosis not present

## 2016-12-01 DIAGNOSIS — D72819 Decreased white blood cell count, unspecified: Secondary | ICD-10-CM | POA: Diagnosis not present

## 2016-12-15 DIAGNOSIS — F4310 Post-traumatic stress disorder, unspecified: Secondary | ICD-10-CM | POA: Diagnosis not present

## 2017-01-08 DIAGNOSIS — F4310 Post-traumatic stress disorder, unspecified: Secondary | ICD-10-CM | POA: Diagnosis not present

## 2017-02-26 DIAGNOSIS — F4310 Post-traumatic stress disorder, unspecified: Secondary | ICD-10-CM | POA: Diagnosis not present

## 2017-03-12 DIAGNOSIS — F4310 Post-traumatic stress disorder, unspecified: Secondary | ICD-10-CM | POA: Diagnosis not present

## 2017-04-05 DIAGNOSIS — F4310 Post-traumatic stress disorder, unspecified: Secondary | ICD-10-CM | POA: Diagnosis not present

## 2017-04-05 DIAGNOSIS — B373 Candidiasis of vulva and vagina: Secondary | ICD-10-CM | POA: Diagnosis not present

## 2017-04-05 DIAGNOSIS — N898 Other specified noninflammatory disorders of vagina: Secondary | ICD-10-CM | POA: Diagnosis not present

## 2017-04-23 DIAGNOSIS — F4310 Post-traumatic stress disorder, unspecified: Secondary | ICD-10-CM | POA: Diagnosis not present

## 2017-04-26 DIAGNOSIS — Z7189 Other specified counseling: Secondary | ICD-10-CM | POA: Diagnosis not present

## 2017-04-26 DIAGNOSIS — F411 Generalized anxiety disorder: Secondary | ICD-10-CM | POA: Diagnosis not present

## 2017-05-03 DIAGNOSIS — F4310 Post-traumatic stress disorder, unspecified: Secondary | ICD-10-CM | POA: Diagnosis not present

## 2017-06-19 DIAGNOSIS — R1110 Vomiting, unspecified: Secondary | ICD-10-CM | POA: Diagnosis not present

## 2017-06-21 ENCOUNTER — Emergency Department (HOSPITAL_BASED_OUTPATIENT_CLINIC_OR_DEPARTMENT_OTHER)
Admission: EM | Admit: 2017-06-21 | Discharge: 2017-06-21 | Disposition: A | Discharge status: Home / Self Care | Payer: BLUE CROSS/BLUE SHIELD | Attending: Emergency Medicine | Admitting: Emergency Medicine

## 2017-06-21 ENCOUNTER — Encounter (HOSPITAL_BASED_OUTPATIENT_CLINIC_OR_DEPARTMENT_OTHER): Payer: Self-pay

## 2017-06-21 DIAGNOSIS — R112 Nausea with vomiting, unspecified: Secondary | ICD-10-CM | POA: Insufficient documentation

## 2017-06-21 DIAGNOSIS — R509 Fever, unspecified: Secondary | ICD-10-CM | POA: Diagnosis not present

## 2017-06-21 DIAGNOSIS — Z79899 Other long term (current) drug therapy: Secondary | ICD-10-CM | POA: Insufficient documentation

## 2017-06-21 DIAGNOSIS — R5381 Other malaise: Secondary | ICD-10-CM | POA: Diagnosis not present

## 2017-06-21 LAB — CBC WITH DIFFERENTIAL/PLATELET
Basophils Absolute: 0 10*3/uL (ref 0.0–0.1)
Basophils Relative: 0 %
Eosinophils Absolute: 0.1 10*3/uL (ref 0.0–0.7)
Eosinophils Relative: 3 %
HCT: 38.7 % (ref 36.0–46.0)
Hemoglobin: 14.1 g/dL (ref 12.0–15.0)
Lymphocytes Relative: 35 %
Lymphs Abs: 1.7 10*3/uL (ref 0.7–4.0)
MCH: 33.2 pg (ref 26.0–34.0)
MCHC: 36.4 g/dL — ABNORMAL HIGH (ref 30.0–36.0)
MCV: 91.1 fL (ref 78.0–100.0)
Monocytes Absolute: 0.5 10*3/uL (ref 0.1–1.0)
Monocytes Relative: 10 %
Neutro Abs: 2.5 10*3/uL (ref 1.7–7.7)
Neutrophils Relative %: 52 %
Platelets: 176 10*3/uL (ref 150–400)
RBC: 4.25 MIL/uL (ref 3.87–5.11)
RDW: 12.3 % (ref 11.5–15.5)
WBC: 4.7 10*3/uL (ref 4.0–10.5)

## 2017-06-21 LAB — BASIC METABOLIC PANEL
Anion gap: 9 (ref 5–15)
BUN: 10 mg/dL (ref 6–20)
CO2: 25 mmol/L (ref 22–32)
Calcium: 9.1 mg/dL (ref 8.9–10.3)
Chloride: 106 mmol/L (ref 101–111)
Creatinine, Ser: 0.58 mg/dL (ref 0.44–1.00)
GFR calc Af Amer: >60 mL/min (ref >60)
GFR calc non Af Amer: >60 mL/min (ref >60)
Glucose, Bld: 94 mg/dL (ref 65–99)
Potassium: 3.7 mmol/L (ref 3.5–5.1)
Sodium: 140 mmol/L (ref 135–145)

## 2017-06-21 LAB — URINALYSIS, ROUTINE W REFLEX MICROSCOPIC
Bilirubin Urine: NEGATIVE
Glucose, UA: NEGATIVE mg/dL
Hgb urine dipstick: NEGATIVE
Ketones, ur: NEGATIVE mg/dL
Leukocytes, UA: NEGATIVE
Nitrite: NEGATIVE
Protein, ur: NEGATIVE mg/dL
Specific Gravity, Urine: 1.009 (ref 1.005–1.030)
pH: 7.5 (ref 5.0–8.0)

## 2017-06-21 LAB — PREGNANCY, URINE: Preg Test, Ur: NEGATIVE

## 2017-06-21 MED ORDER — PROMETHAZINE HCL 25 MG PO TABS: 25.0000 mg | ORAL_TABLET | Freq: Four times a day (QID) | ORAL | 0 refills | Status: AC | PRN

## 2017-06-21 MED ORDER — PROMETHAZINE HCL 25 MG/ML IJ SOLN
12.5000 mg | Freq: Once | INTRAMUSCULAR | Status: AC
  Administered 2017-06-21: 12.5 mg via INTRAVENOUS
  Filled 2017-06-21: qty 1

## 2017-06-21 MED ORDER — SODIUM CHLORIDE 0.9 % IV BOLUS (SEPSIS)
1000.0000 mL | Freq: Once | INTRAVENOUS | Status: AC
  Administered 2017-06-21: 1000 mL via INTRAVENOUS

## 2017-06-21 NOTE — ED Provider Notes (Signed)
[facility] Provider Note   CSN: [id number] Arrival date & time: 06/21/17  1551  By signing my name below, I, Linna DarnerRussell Turner, attest that this documentation has been prepared under the direction and in the presence of physician practitioner, Raeford RazorKohut, Dmonte Maher, MD. Electronically Signed: Linna Darnerussell Turner, Scribe. 06/21/2017. 5:31 PM.  History   Chief Complaint Chief Complaint  Patient presents with  . Emesis   The history is provided by the patient. No language interpreter was used.    HPI Comments: Evelyn Kerr is a 41 y.o. female who presents to the Emergency Department complaining of intermittent nausea, vomiting, and malaise for several weeks. She returned from a five week trip to [place] two days ago. During the second week in [place] she developed intermittent nausea and vomiting. Her symptoms would present for a few days and then resolve for a few days. Patient states that she started vomiting on her return flight to the [place] and she was subsequently treated with IV fluids and Zofran in [place]. There were no labs drawn in [place] and she did not receive any prescriptions, as the purpose was only to make her feel well enough to continue flying. She then flew back to [place] and states she felt "okay" yesterday. Patient developed nausea and vomiting this morning and has felt unwell throughout the day. She has had some subjective fevers today. There are no alleviating factors noted. She notes that she had a sick contact with persistent diarrhea in [place] but was not around anyone with persistent vomiting. Patient denies dysuria, difficulty urinating, abdominal pain, diarrhea, or any other associated symptoms.  Past Medical History:  Diagnosis Date  . Depression   . Kidney stones   . Post-operative nausea and vomiting   . Urinary tract infection    chronic     There are no active problems to display for this patient.   Past Surgical History:  Procedure  Laterality Date  . CESAREAN SECTION    . LIPOSUCTION     with fat transfer  . TUBAL LIGATION      OB History    No data available       Home Medications    Prior to Admission medications   Medication Sig Start Date End Date Taking? Authorizing Provider  acidophilus (RISAQUAD) CAPS capsule Take 1 capsule by mouth daily.     [provider]  Ascorbic Acid (VITAMIN C) 100 MG tablet Take 100 mg by mouth daily.    [provider]  b complex vitamins tablet Take 1 tablet by mouth daily.    [provider]  cetirizine (ZYRTEC) 10 MG tablet Take 10 mg by mouth daily.    [provider]  cholecalciferol (VITAMIN D) 1000 units tablet Take 1,000 Units by mouth daily.    [provider]  VITAMIN K PO Take 1 tablet by mouth daily.    [provider]    Family History No family history on file.  Social History Social History  Substance Use Topics  . Smoking status: Never Smoker  . Smokeless tobacco: Never Used  . Alcohol use Yes     Comment: occ     Allergies   Other   Review of Systems Review of Systems  All other systems reviewed and are negative.  Physical Exam Updated Vital Signs BP 125/84 (BP Location: Right Arm)   Pulse 78   Temp 98.7 F (37.1 C) (Oral)   Resp 16   LMP 06/05/2017  SpO2 99%   Physical Exam  Constitutional: She appears well-developed and well-nourished.  HENT:  Head: Normocephalic.  Right Ear: External ear normal.  Left Ear: External ear normal.  Nose: Nose normal.  Mouth/Throat: Oropharynx is clear and moist.  Eyes: Conjunctivae are normal. Right eye exhibits no discharge. Left eye exhibits no discharge.  Neck: Normal range of motion.  Cardiovascular: Normal rate, regular rhythm and normal heart sounds.   No murmur heard. Pulmonary/Chest: Effort normal and breath sounds normal. No respiratory distress. She has no wheezes. She has no rales.  Abdominal: Soft. She exhibits no distension.  There is no tenderness. There is no rebound and no guarding.  Musculoskeletal: Normal range of motion. She exhibits no edema or tenderness.  Neurological: She is alert. No cranial nerve deficit. Coordination normal.  Skin: Skin is warm and dry. No rash noted. No erythema. No pallor.  Psychiatric: She has a normal mood and affect. Her behavior is normal.  Nursing note and vitals reviewed.  ED Treatments / Results  Labs (all labs ordered are listed, but only abnormal results are displayed) Labs Reviewed  CBC WITH DIFFERENTIAL/PLATELET  BASIC METABOLIC PANEL  URINALYSIS, ROUTINE W REFLEX MICROSCOPIC  PREGNANCY, URINE    EKG  EKG Interpretation None       Radiology No results found.  Procedures Procedures (including critical care time)  DIAGNOSTIC STUDIES: Oxygen Saturation is 99% on RA, normal by my interpretation.    COORDINATION OF CARE: 5:29 PM Discussed treatment plan with pt at bedside and pt agreed to plan.  Medications Ordered in ED Medications  sodium chloride 0.9 % bolus 1,000 mL (not administered)  promethazine (PHENERGAN) injection 12.5 mg (not administered)     Initial Impression / Assessment and Plan / ED Course  I have reviewed the triage vital signs and the nursing notes.  Pertinent labs & imaging results that were available during my care of the patient were reviewed by me and considered in my medical decision making (see chart for details).     Labs unremarkable. Feeling better with meds/IVF. I doubt emergent process.   Final Clinical Impressions(s) / ED Diagnoses   Final diagnoses:  Non-intractable vomiting with nausea, unspecified vomiting type    New Prescriptions New Prescriptions   No medications on file   I personally preformed the services scribed in my presence. The recorded information has been reviewed is accurate. Raeford RazorStephen Lakyla Biswas, MD.    Raeford RazorKohut, Mona Ayars, MD 07/08/17 [phone]

## 2017-06-21 NOTE — ED Triage Notes (Signed)
C/o intermittent n/v-recent travel to [place] where she had n/v there-was also treated with IVF in [place] 2 days ago-return of "feeling crappy" today-denies diarrhea-NAD-steady gait

## 2017-06-27 DIAGNOSIS — F4310 Post-traumatic stress disorder, unspecified: Secondary | ICD-10-CM | POA: Diagnosis not present

## 2017-07-11 DIAGNOSIS — F4310 Post-traumatic stress disorder, unspecified: Secondary | ICD-10-CM | POA: Diagnosis not present

## 2017-08-08 DIAGNOSIS — F4310 Post-traumatic stress disorder, unspecified: Secondary | ICD-10-CM | POA: Diagnosis not present

## 2017-09-04 DIAGNOSIS — F4310 Post-traumatic stress disorder, unspecified: Secondary | ICD-10-CM | POA: Diagnosis not present

## 2017-09-19 DIAGNOSIS — F4310 Post-traumatic stress disorder, unspecified: Secondary | ICD-10-CM | POA: Diagnosis not present

## 2017-10-31 DIAGNOSIS — F4310 Post-traumatic stress disorder, unspecified: Secondary | ICD-10-CM | POA: Diagnosis not present

## 2017-11-28 DIAGNOSIS — F4310 Post-traumatic stress disorder, unspecified: Secondary | ICD-10-CM | POA: Diagnosis not present

## 2017-12-14 DIAGNOSIS — F4310 Post-traumatic stress disorder, unspecified: Secondary | ICD-10-CM | POA: Diagnosis not present

## 2017-12-26 DIAGNOSIS — F4310 Post-traumatic stress disorder, unspecified: Secondary | ICD-10-CM | POA: Diagnosis not present

## 2017-12-27 DIAGNOSIS — F4310 Post-traumatic stress disorder, unspecified: Secondary | ICD-10-CM | POA: Diagnosis not present

## 2018-01-02 DIAGNOSIS — F4310 Post-traumatic stress disorder, unspecified: Secondary | ICD-10-CM | POA: Diagnosis not present

## 2018-01-14 DIAGNOSIS — F4310 Post-traumatic stress disorder, unspecified: Secondary | ICD-10-CM | POA: Diagnosis not present

## 2018-01-25 DIAGNOSIS — F4310 Post-traumatic stress disorder, unspecified: Secondary | ICD-10-CM | POA: Diagnosis not present

## 2018-02-06 DIAGNOSIS — F4310 Post-traumatic stress disorder, unspecified: Secondary | ICD-10-CM | POA: Diagnosis not present

## 2018-03-20 DIAGNOSIS — F4310 Post-traumatic stress disorder, unspecified: Secondary | ICD-10-CM | POA: Diagnosis not present

## 2018-04-02 DIAGNOSIS — J029 Acute pharyngitis, unspecified: Secondary | ICD-10-CM | POA: Diagnosis not present

## 2018-04-02 DIAGNOSIS — J069 Acute upper respiratory infection, unspecified: Secondary | ICD-10-CM | POA: Diagnosis not present

## 2018-04-02 DIAGNOSIS — R6889 Other general symptoms and signs: Secondary | ICD-10-CM | POA: Diagnosis not present

## 2018-04-17 DIAGNOSIS — F4310 Post-traumatic stress disorder, unspecified: Secondary | ICD-10-CM | POA: Diagnosis not present

## 2018-05-01 DIAGNOSIS — F4310 Post-traumatic stress disorder, unspecified: Secondary | ICD-10-CM | POA: Diagnosis not present

## 2018-05-09 DIAGNOSIS — J329 Chronic sinusitis, unspecified: Secondary | ICD-10-CM | POA: Diagnosis not present

## 2018-05-29 DIAGNOSIS — F4310 Post-traumatic stress disorder, unspecified: Secondary | ICD-10-CM | POA: Diagnosis not present

## 2018-06-10 IMAGING — US US PELVIS COMPLETE
1 series · 13 of 25 positions shown · non-contrast
Comparison: None

CLINICAL DATA: Pelvic pain

EXAM:
TRANSABDOMINAL AND TRANSVAGINAL ULTRASOUND OF PELVIS
TECHNIQUE: Both transabdominal and transvaginal ultrasound examinations of the
pelvis were performed. Transabdominal technique was performed for
global imaging of the pelvis including uterus, ovaries, adnexal
regions, and pelvic cul-de-sac. It was necessary to proceed with
endovaginal exam following the transabdominal exam to visualize the
endometrium and ovaries.

[Series 1: us pelvis complete · 0.20mm/px · 95 acquisitions, 13 frames shown]
[im 1/95]
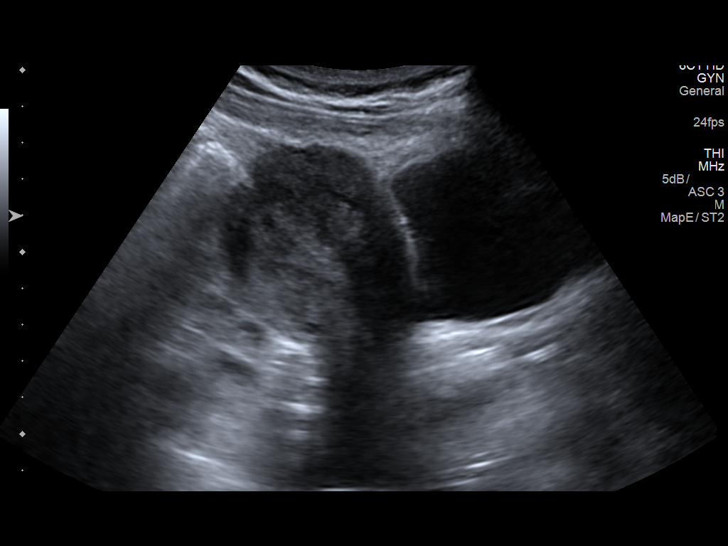
[im 8/95]
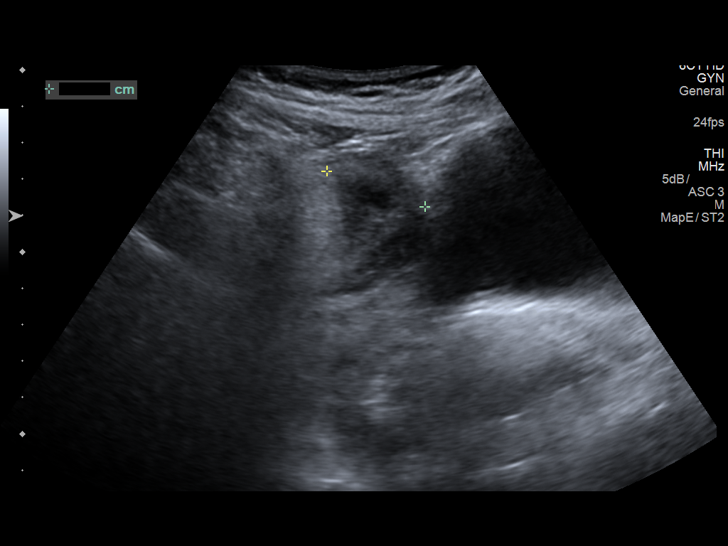
[im 16/95]
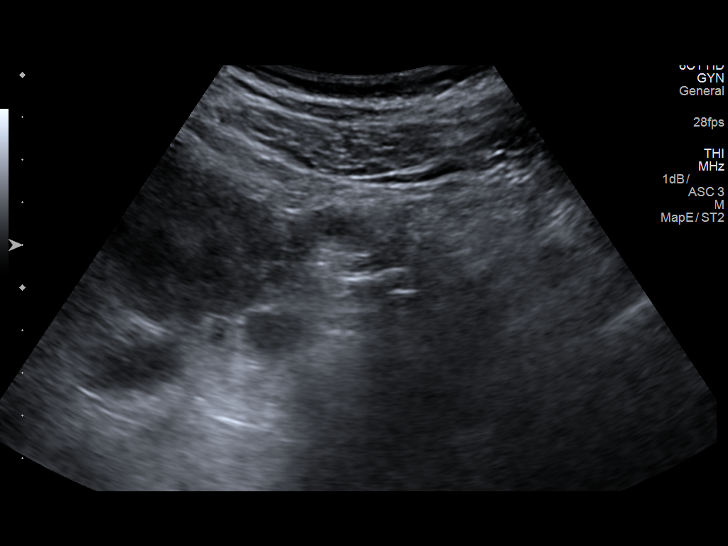
[im 24/95]
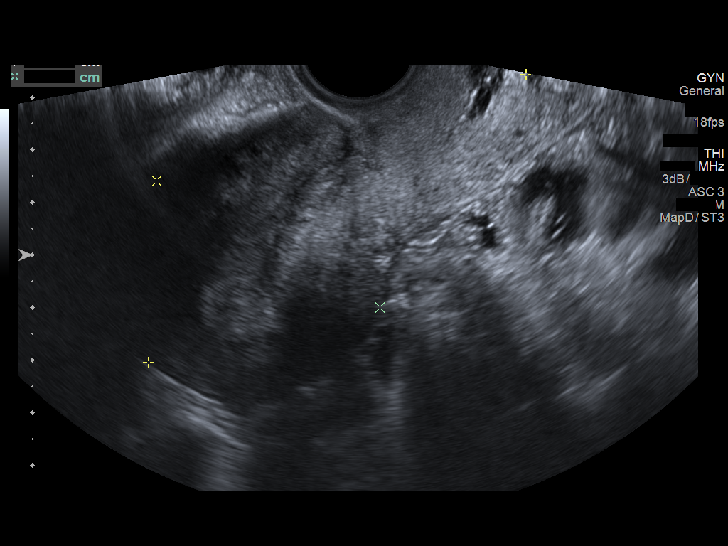
[im 32/95]
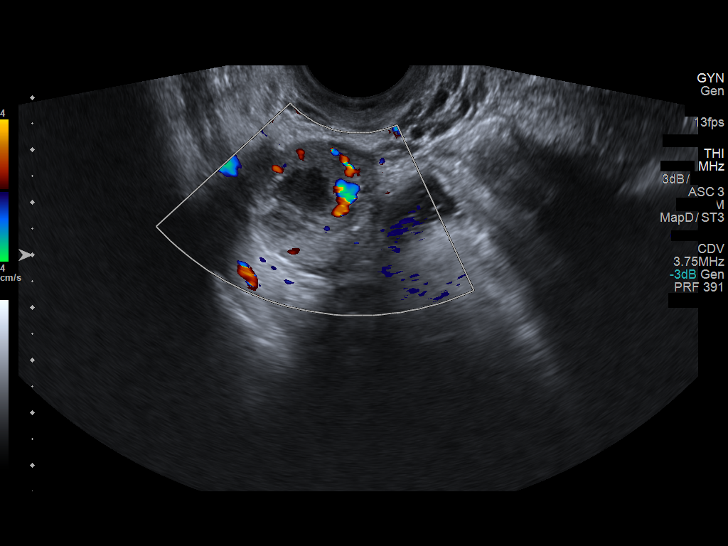
[im 40/95]
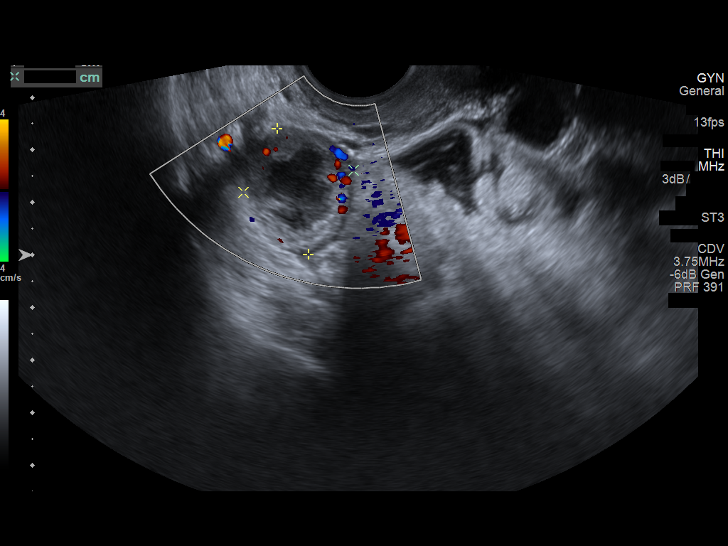
[im 48/95]
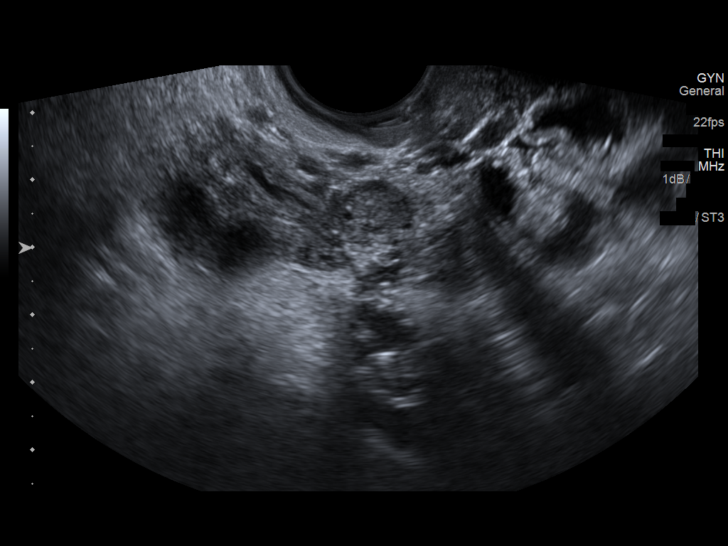
[im 55/95]
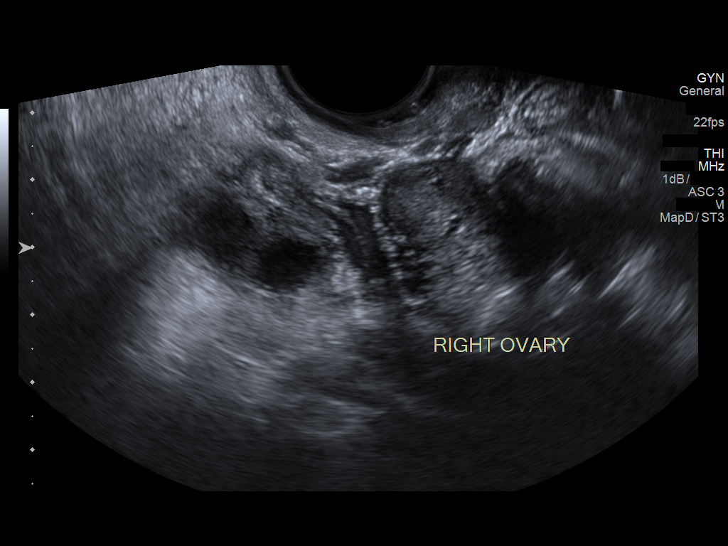
[im 63/95]
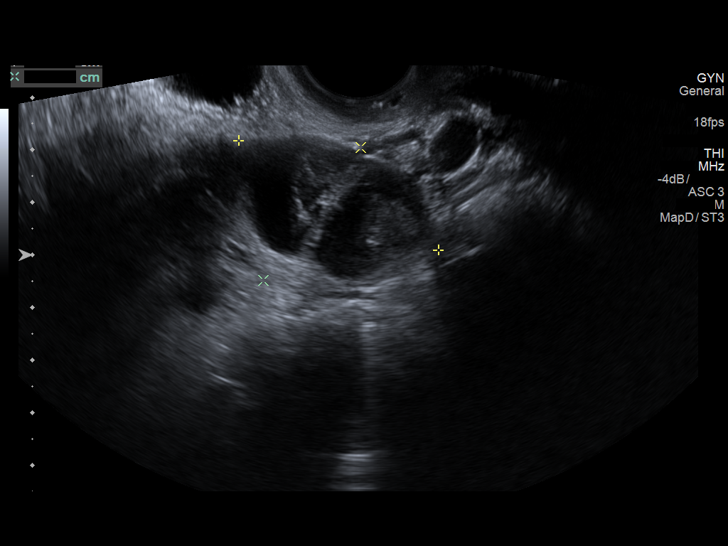
[im 71/95]
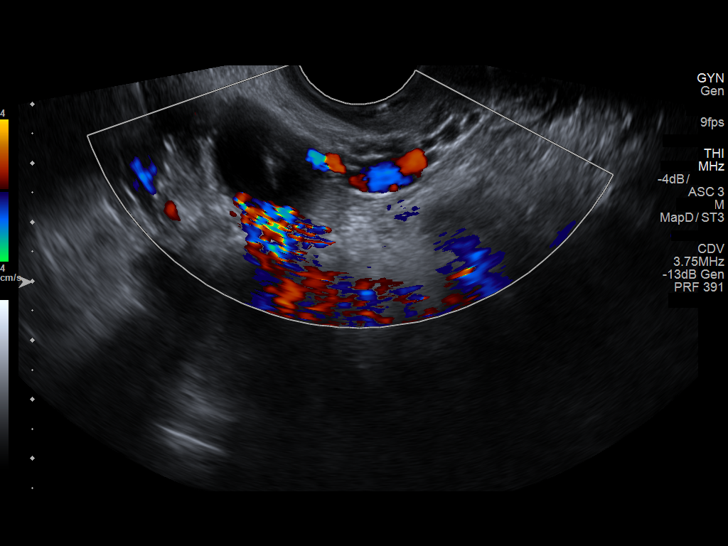
[im 79/95]
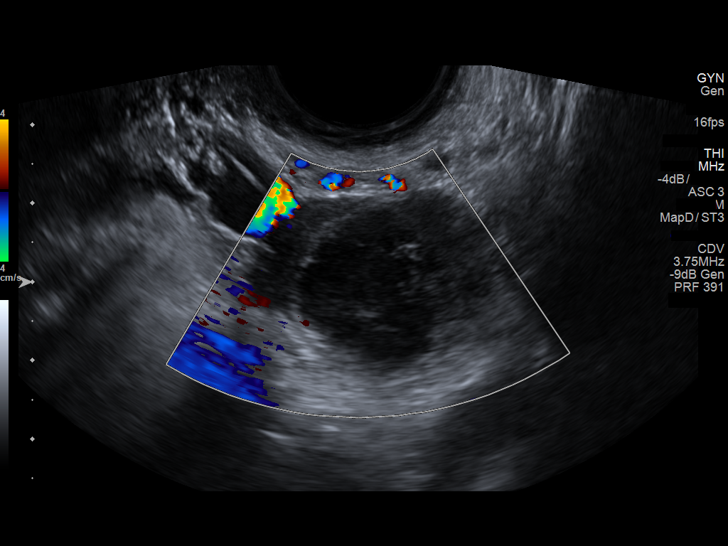
[im 87/95]
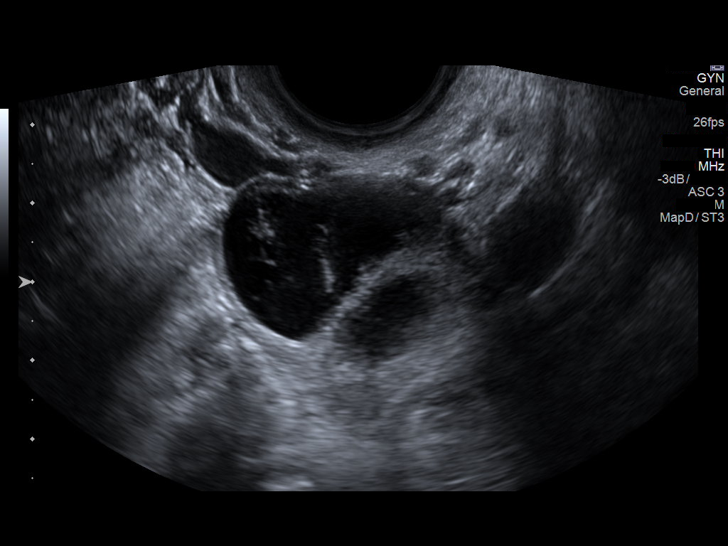
[im 95/95]
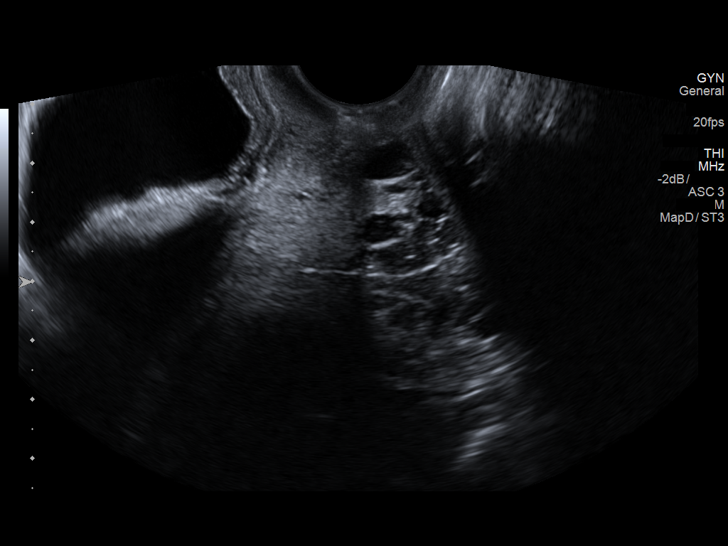

[13 of 25 positions shown; findings below may reference images not displayed]

FINDINGS: Uterus

Measurements: 9 x 4.9 x 5.4 cm. No fibroids or other mass
visualized.

Endometrium

Thickness: 16.3 mm.  No focal abnormality visualized.

Right ovary

Measurements: 3.6 x 3 x 2.7 cm. 2.5 x 2.1 x 2.1 cm complex
hypoechoic right ovarian mass without internal Doppler flow.

Left ovary

Measurements: 2.9 x 2.2 x 3 cm. 2.2 x 2 x 2.3 cm complex hypoechoic
right ovarian mass with thin internal septations, but no internal
Doppler flow.

Other findings

Small amount pelvic free fluid. Tubular bilateral hypoechoic
structures likely reflecting hydrosalpinx.
IMPRESSION: 1. Bilateral hypoechoic ovarian masses likely reflecting hemorrhagic
cysts or endometriomas. Short-interval follow up ultrasound in 6-12
weeks is recommended, preferably during the week following the
patient's normal menses.

## 2018-06-12 DIAGNOSIS — F4310 Post-traumatic stress disorder, unspecified: Secondary | ICD-10-CM | POA: Diagnosis not present

## 2018-10-01 IMAGING — CT CT ABD-PELV W/ CM
2 of 4 series · 16 of 46 positions shown, 18 images · IV contrast (Omni 300)
Comparison: None.

CLINICAL DATA: Right lower quadrant pain.  Increasing pain.

EXAM:
CT ABDOMEN AND PELVIS WITH CONTRAST
TECHNIQUE: Multidetector CT imaging of the abdomen and pelvis was performed
using the standard protocol following bolus administration of
intravenous contrast.
CONTRAST:  100mL X0EXV5-MYY IOPAMIDOL ([id number]) INJECTION 61%

[Series 2: a/p w/ 5mm · axial · 0.61mm/px · z∈[-1064,-654]mm · 13 of 90 slices shown, 15 images]
[im 4/90  soft-tissue]
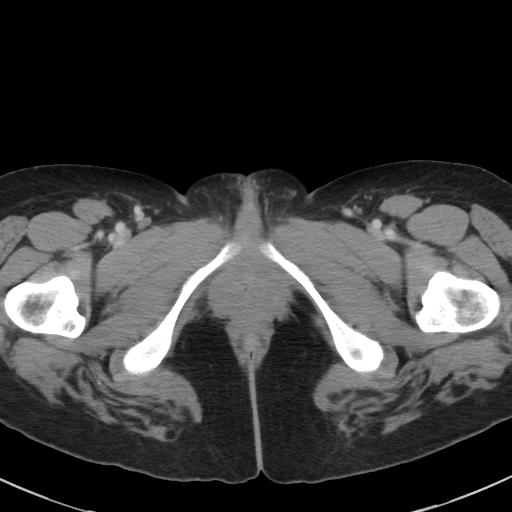
[im 4/90  bone]
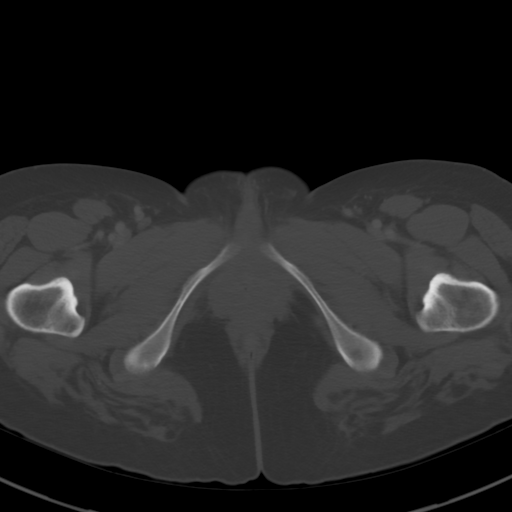
[im 12/90  soft-tissue]
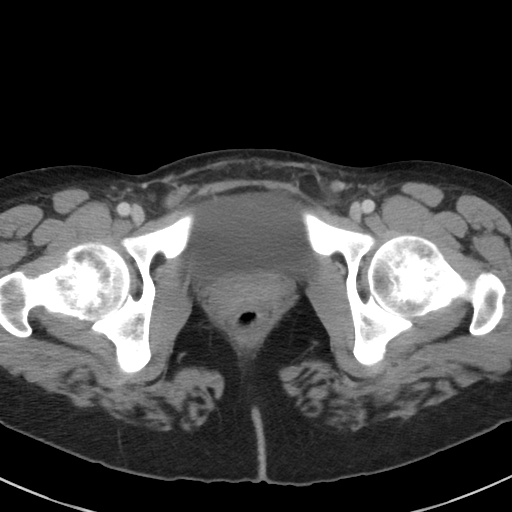
[im 19/90  soft-tissue]
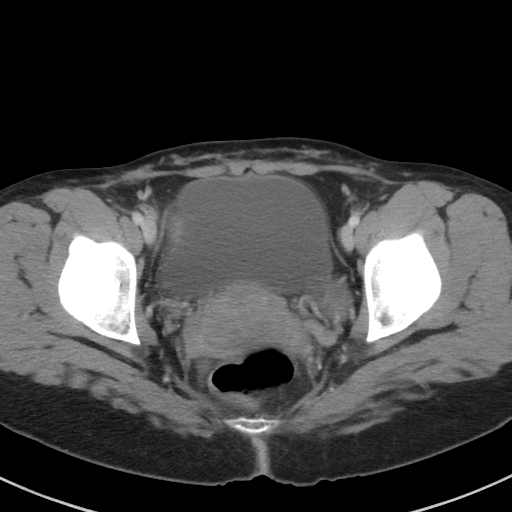
[im 26/90  soft-tissue]
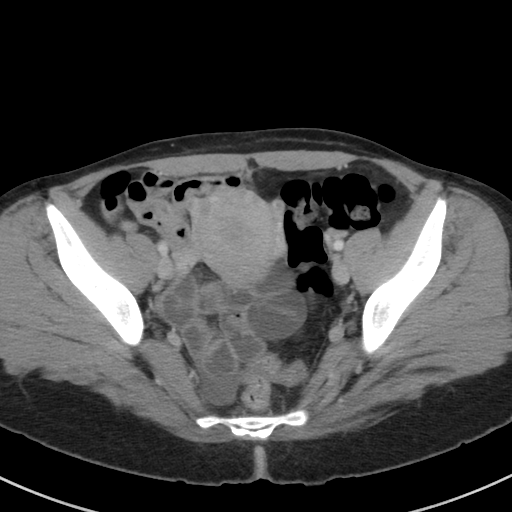
[im 30/90  soft-tissue]
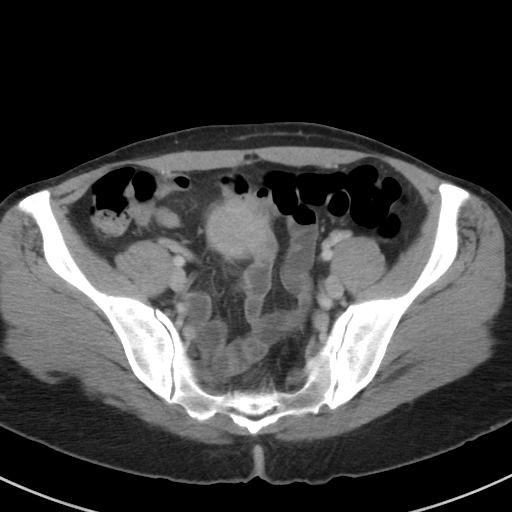
[im 38/90  soft-tissue]
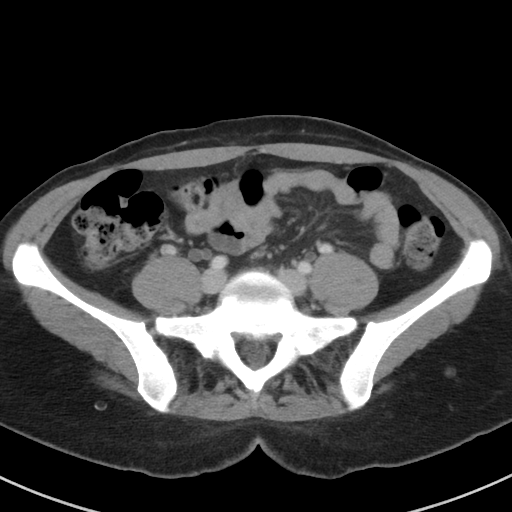
[im 45/90  soft-tissue]
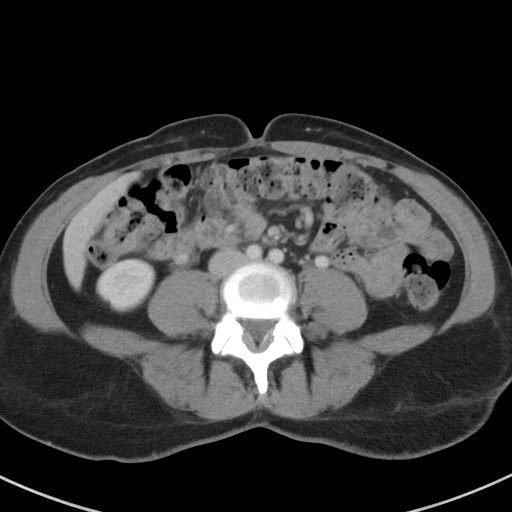
[im 52/90  soft-tissue]
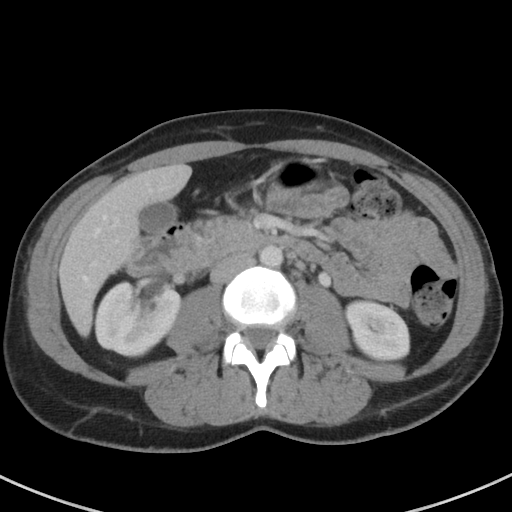
[im 60/90  soft-tissue]
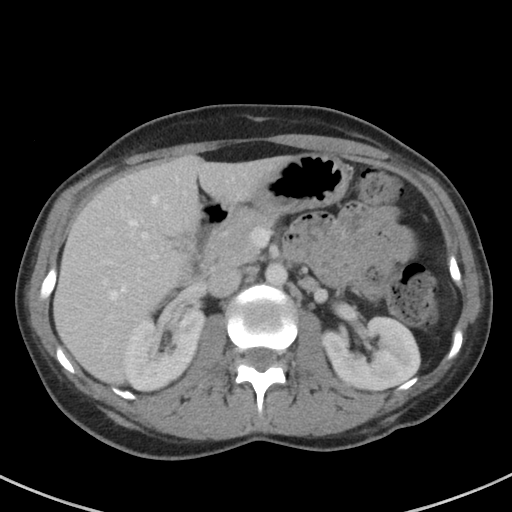
[im 60/90  bone]
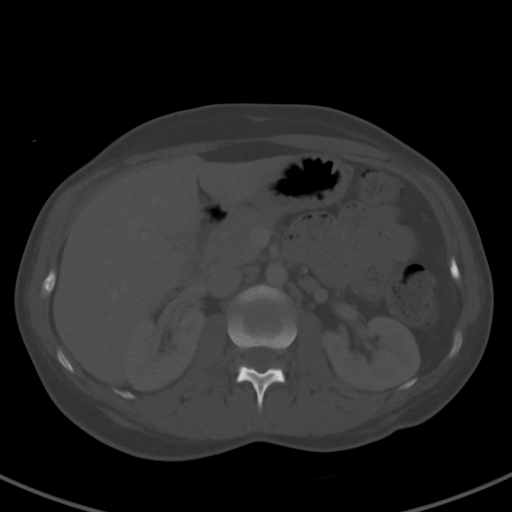
[im 64/90  soft-tissue]
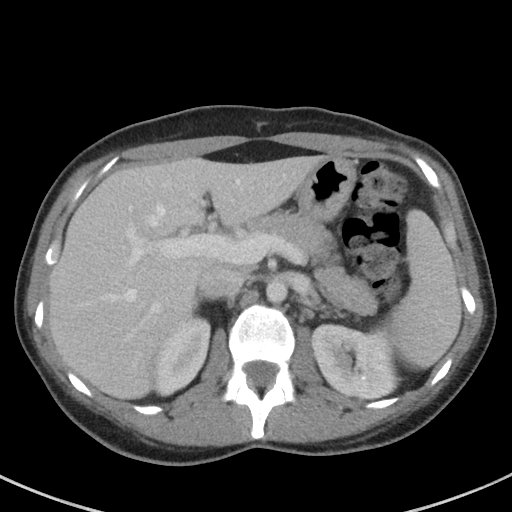
[im 71/90  soft-tissue]
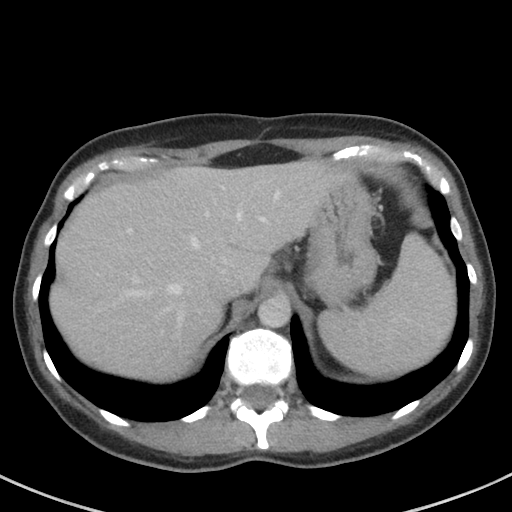
[im 78/90  soft-tissue]
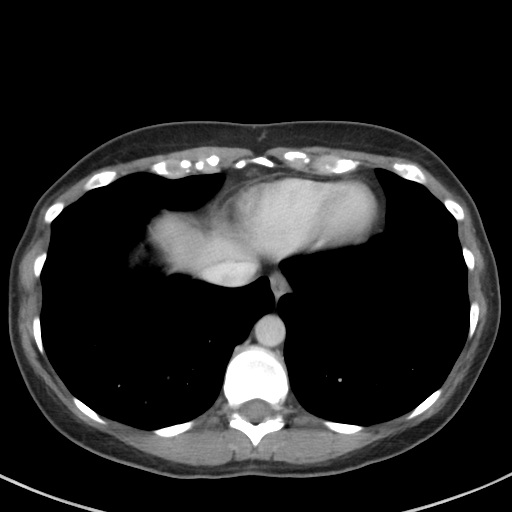
[im 86/90  soft-tissue]
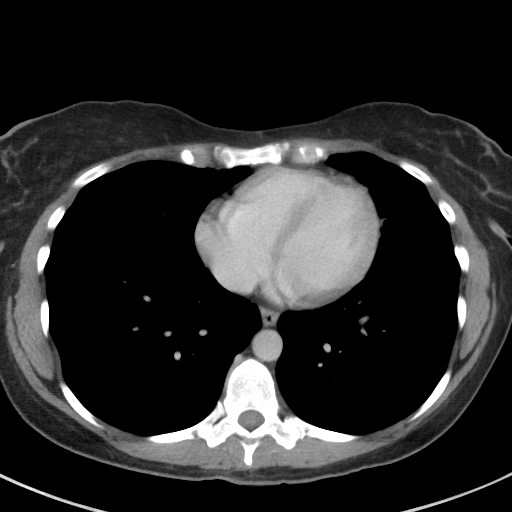

[Series 5: a/p w/ cor · coronal · 0.68mm/px · 3 of 119 slices shown]
[im 40/119  soft-tissue]
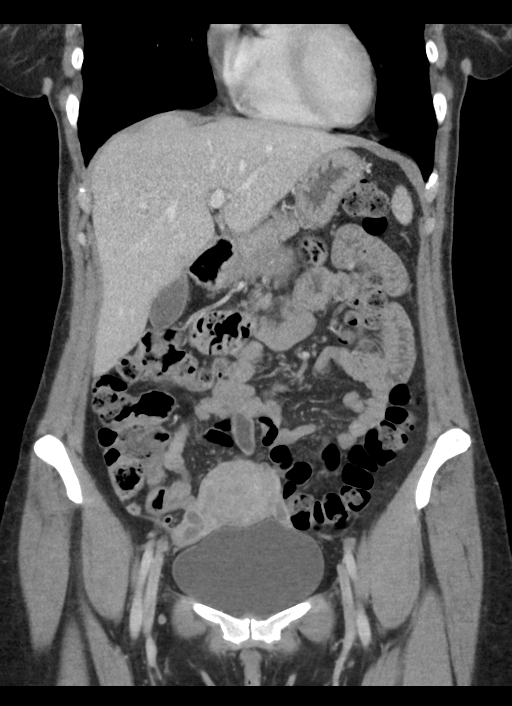
[im 53/119  soft-tissue]
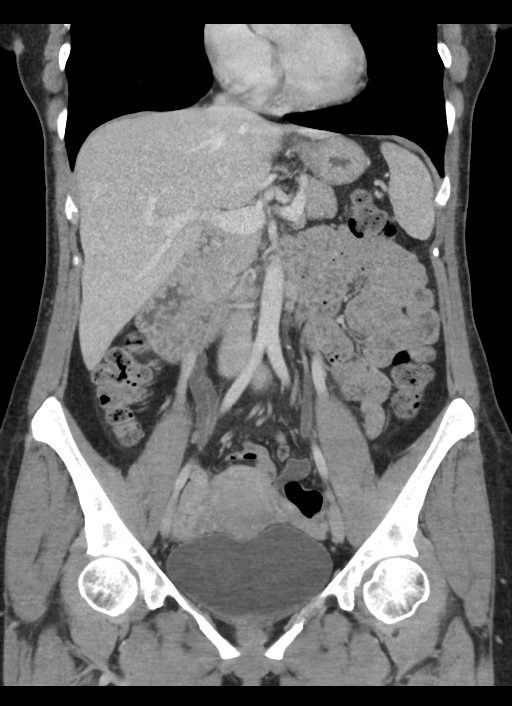
[im 66/119  soft-tissue]
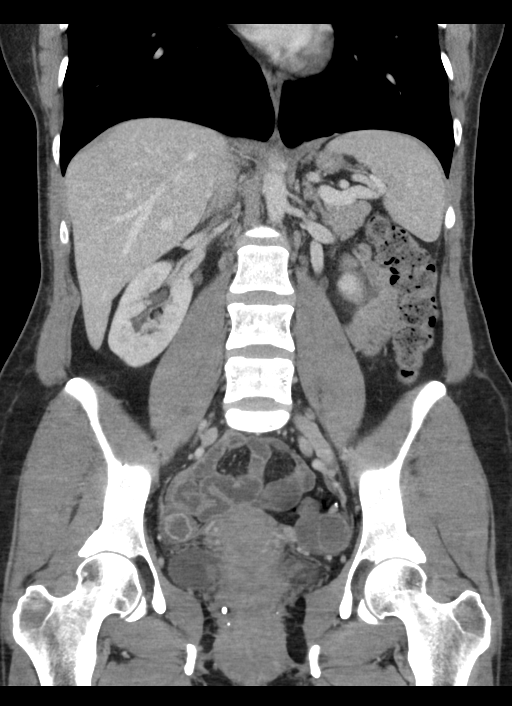

[16 of 46 positions shown; findings below may reference images not displayed]

FINDINGS: Lower chest: No acute abnormality.

Hepatobiliary: No focal liver abnormality is seen. No gallstones,
gallbladder wall thickening, or biliary dilatation.

Pancreas: Unremarkable. No pancreatic ductal dilatation or
surrounding inflammatory changes.

Spleen: Normal in size without focal abnormality.

Adrenals/Urinary Tract: Adrenal glands are unremarkable. Kidneys are
normal, without renal calculi, focal lesion, or hydronephrosis.
Duplicated right renal collecting system. Bladder is unremarkable.

Stomach/Bowel: Stomach is within normal limits. Appendix appears
normal. No evidence of bowel wall thickening, distention, or
inflammatory changes.

Vascular/Lymphatic: No significant vascular findings are present. No
enlarged abdominal or pelvic lymph nodes.

Reproductive: Tubular enhancing structure in the right adnexa most
concerning or dilated fallopian tubes with surrounding inflammatory
changes. Tubular peripheral enhancing structure in the left adnexal
region concerning for a mildly dilated left fallopian tube. Small
amount of pelvic free fluid. 3.7 cm cystic structure in the left
adnexa with a thin internal septation likely reflecting a mildly
complicated cyst.

Other: No abdominal wall hernia or abnormality.

Musculoskeletal: No acute or significant osseous findings.
IMPRESSION: 1. Findings concerning for bilateral tubo-ovarian abscesses.

## 2018-11-29 DIAGNOSIS — F4310 Post-traumatic stress disorder, unspecified: Secondary | ICD-10-CM | POA: Diagnosis not present

## 2019-01-01 DIAGNOSIS — F4310 Post-traumatic stress disorder, unspecified: Secondary | ICD-10-CM | POA: Diagnosis not present

## 2019-01-21 DIAGNOSIS — F4310 Post-traumatic stress disorder, unspecified: Secondary | ICD-10-CM | POA: Diagnosis not present

## 2019-03-31 DIAGNOSIS — R6883 Chills (without fever): Secondary | ICD-10-CM | POA: Diagnosis not present

## 2019-03-31 DIAGNOSIS — J029 Acute pharyngitis, unspecified: Secondary | ICD-10-CM | POA: Diagnosis not present

## 2019-04-02 DIAGNOSIS — Z01419 Encounter for gynecological examination (general) (routine) without abnormal findings: Secondary | ICD-10-CM | POA: Diagnosis not present

## 2019-04-02 DIAGNOSIS — Z124 Encounter for screening for malignant neoplasm of cervix: Secondary | ICD-10-CM | POA: Diagnosis not present

## 2019-04-02 DIAGNOSIS — Z113 Encounter for screening for infections with a predominantly sexual mode of transmission: Secondary | ICD-10-CM | POA: Diagnosis not present

## 2020-05-03 ENCOUNTER — Other Ambulatory Visit (HOSPITAL_COMMUNITY)
Admission: RE | Admit: 2020-05-03 | Discharge: 2020-05-03 | Disposition: A | Discharge status: Home / Self Care | Payer: BLUE CROSS/BLUE SHIELD | Source: Ambulatory Visit | Attending: Family Medicine | Admitting: Family Medicine

## 2020-05-03 ENCOUNTER — Other Ambulatory Visit: Payer: Self-pay | Admitting: Family Medicine

## 2020-05-03 DIAGNOSIS — Z124 Encounter for screening for malignant neoplasm of cervix: Secondary | ICD-10-CM | POA: Insufficient documentation

## 2020-05-06 LAB — CYTOLOGY - PAP
Comment: NEGATIVE
High risk HPV: POSITIVE — AB

## 2020-06-03 ENCOUNTER — Other Ambulatory Visit: Payer: Self-pay | Admitting: Nurse Practitioner

## 2022-09-10 ENCOUNTER — Encounter (HOSPITAL_BASED_OUTPATIENT_CLINIC_OR_DEPARTMENT_OTHER): Payer: Self-pay | Admitting: Emergency Medicine

## 2022-09-10 ENCOUNTER — Other Ambulatory Visit: Payer: Self-pay

## 2022-09-10 ENCOUNTER — Inpatient Hospital Stay (HOSPITAL_BASED_OUTPATIENT_CLINIC_OR_DEPARTMENT_OTHER)
Admission: EM | Admit: 2022-09-10 | Discharge: 2022-09-13 | DRG: 759 | Disposition: A | Discharge status: Home / Self Care | Payer: Self-pay | Attending: Obstetrics & Gynecology | Admitting: Obstetrics & Gynecology

## 2022-09-10 DIAGNOSIS — Z9851 Tubal ligation status: Secondary | ICD-10-CM

## 2022-09-10 DIAGNOSIS — B9689 Other specified bacterial agents as the cause of diseases classified elsewhere: Secondary | ICD-10-CM | POA: Diagnosis present

## 2022-09-10 DIAGNOSIS — Z87442 Personal history of urinary calculi: Secondary | ICD-10-CM

## 2022-09-10 DIAGNOSIS — N7093 Salpingitis and oophoritis, unspecified: Principal | ICD-10-CM | POA: Diagnosis present

## 2022-09-10 DIAGNOSIS — N760 Acute vaginitis: Secondary | ICD-10-CM | POA: Diagnosis present

## 2022-09-10 DIAGNOSIS — R1031 Right lower quadrant pain: Secondary | ICD-10-CM | POA: Diagnosis present

## 2022-09-10 LAB — CBC
HCT: 36.9 % (ref 36.0–46.0)
Hemoglobin: 12.7 g/dL (ref 12.0–15.0)
MCH: 32.6 pg (ref 26.0–34.0)
MCHC: 34.4 g/dL (ref 30.0–36.0)
MCV: 94.9 fL (ref 80.0–100.0)
Platelets: 237 10*3/uL (ref 150–400)
RBC: 3.89 MIL/uL (ref 3.87–5.11)
RDW: 12.4 % (ref 11.5–15.5)
WBC: 7.5 10*3/uL (ref 4.0–10.5)
nRBC: 0 % (ref 0.0–0.2)

## 2022-09-10 MED ORDER — ONDANSETRON HCL 4 MG/2ML IJ SOLN
4.0000 mg | Freq: Once | INTRAMUSCULAR | Status: AC
  Administered 2022-09-10: 4 mg via INTRAVENOUS
  Filled 2022-09-10: qty 2

## 2022-09-10 MED ORDER — MORPHINE SULFATE (PF) 4 MG/ML IV SOLN
4.0000 mg | Freq: Once | INTRAVENOUS | Status: AC
  Administered 2022-09-10: 4 mg via INTRAVENOUS
  Filled 2022-09-10: qty 1

## 2022-09-10 NOTE — ED Triage Notes (Signed)
Pt via pov from home with abdominal pain x 1 week. Pt reports that she had some cramping and spotting, and now has blood that is heavier than any period she has ever had. She reports that she has hx of ovarian cyst, but reports that this feels different. Pt alert & oriented, nad noted.

## 2022-09-10 NOTE — ED Provider Notes (Signed)
[facility] Provider Note   CSN: [id number] Arrival date & time: 09/10/22  1926     History {Add pertinent medical, surgical, social history, OB history to HPI:1} Chief Complaint  Patient presents with   Abdominal Pain    Evelyn Kerr is a 46 y.o. female.  HPI     This is a 46 year old female who presents with abdominal pain.  Patient reports onset of abdominal pain last Monday.  She describes crampy lower abdominal pain that has gotten progressively worse throughout the week.  She states that it is worse at night and in the morning.  She had had sex Saturday a week ago and thought it may be related.  Patient states her normal menstrual period was 2 weeks ago; however she began to spot on Friday and has since had increase in vaginal bleeding.  She does not believe that she is pregnant as she has her tubes tied.  She states that the pain settles mostly in the right lower quadrant.  She has not had any nausea or vomiting.  No urinary symptoms.  No change in bowel movements.  Home Medications Prior to Admission medications   Medication Sig Start Date End Date Taking? Authorizing Provider  acidophilus (RISAQUAD) CAPS capsule Take 1 capsule by mouth daily.     [provider]  Ascorbic Acid (VITAMIN C) 100 MG tablet Take 100 mg by mouth daily.    [provider]  b complex vitamins tablet Take 1 tablet by mouth daily.    [provider]  cetirizine (ZYRTEC) 10 MG tablet Take 10 mg by mouth daily.    [provider]  cholecalciferol (VITAMIN D) 1000 units tablet Take 1,000 Units by mouth daily.    [provider]  promethazine (PHENERGAN) 25 MG tablet Take 1 tablet (25 mg total) by mouth every 6 (six) hours as needed for nausea or vomiting. 06/21/17   Virgel Manifold, MD  VITAMIN K PO Take 1 tablet by mouth daily.    [provider]      Allergies    Other    Review of Systems   Review of Systems   Constitutional:  Negative for fever.  Respiratory:  Negative for shortness of breath.   Cardiovascular:  Negative for chest pain.  Gastrointestinal:  Positive for abdominal pain.  Genitourinary:  Positive for vaginal bleeding. Negative for dysuria.  All other systems reviewed and are negative.   Physical Exam Updated Vital Signs BP 121/65   Pulse 89   Temp 99.2 F (37.3 C)   Resp 20   Ht 1.702 m (5\' 7" )   Wt 74.8 kg   LMP 08/25/2022 (Approximate)   SpO2 98%   BMI 25.84 kg/m  Physical Exam Vitals and nursing note reviewed.  Constitutional:      Appearance: She is well-developed. She is not ill-appearing.  HENT:     Head: Normocephalic and atraumatic.  Eyes:     Pupils: Pupils are equal, round, and reactive to light.  Cardiovascular:     Rate and Rhythm: Normal rate and regular rhythm.     Heart sounds: Normal heart sounds.  Pulmonary:     Effort: Pulmonary effort is normal. No respiratory distress.     Breath sounds: No wheezing.  Abdominal:     General: Bowel sounds are normal.     Palpations: Abdomen is soft.     Tenderness: There is abdominal tenderness in the right upper quadrant and suprapubic area. There is  no guarding or rebound.  Musculoskeletal:     Cervical back: Neck supple.  Skin:    General: Skin is warm and dry.  Neurological:     Mental Status: She is alert and oriented to person, place, and time.  Psychiatric:        Mood and Affect: Mood normal.     ED Results / Procedures / Treatments   Labs (all labs ordered are listed, but only abnormal results are displayed) Labs Reviewed  CBC  COMPREHENSIVE METABOLIC PANEL  URINALYSIS, ROUTINE W REFLEX MICROSCOPIC  HCG, QUANTITATIVE, PREGNANCY    EKG None  Radiology No results found.  Procedures Procedures  {Document cardiac monitor, telemetry assessment procedure when appropriate:1}  Medications Ordered in ED Medications  morphine (PF) 4 MG/ML injection 4 mg (has no administration in time  range)  ondansetron (ZOFRAN) injection 4 mg (has no administration in time range)    ED Course/ Medical Decision Making/ A&P                           Medical Decision Making Amount and/or Complexity of Data Reviewed Labs: ordered.  Risk Prescription drug management.   ***  {Document critical care time when appropriate:1} {Document review of labs and clinical decision tools ie heart score, Chads2Vasc2 etc:1}  {Document your independent review of radiology images, and any outside records:1} {Document your discussion with family members, caretakers, and with consultants:1} {Document social determinants of health affecting pt's care:1} {Document your decision making why or why not admission, treatments were needed:1} Final Clinical Impression(s) / ED Diagnoses Final diagnoses:  None    Rx / DC Orders ED Discharge Orders     None

## 2022-09-11 ENCOUNTER — Emergency Department (HOSPITAL_BASED_OUTPATIENT_CLINIC_OR_DEPARTMENT_OTHER): Payer: Self-pay

## 2022-09-11 ENCOUNTER — Encounter (HOSPITAL_BASED_OUTPATIENT_CLINIC_OR_DEPARTMENT_OTHER): Payer: Self-pay | Admitting: Radiology

## 2022-09-11 ENCOUNTER — Encounter (HOSPITAL_COMMUNITY): Payer: Self-pay

## 2022-09-11 DIAGNOSIS — N7093 Salpingitis and oophoritis, unspecified: Secondary | ICD-10-CM | POA: Diagnosis present

## 2022-09-11 LAB — COMPREHENSIVE METABOLIC PANEL
ALT: 11 U/L (ref 0–44)
AST: 10 U/L — ABNORMAL LOW (ref 15–41)
Albumin: 4.3 g/dL (ref 3.5–5.0)
Alkaline Phosphatase: 45 U/L (ref 38–126)
Anion gap: 9 (ref 5–15)
BUN: 11 mg/dL (ref 6–20)
CO2: 26 mmol/L (ref 22–32)
Calcium: 9.5 mg/dL (ref 8.9–10.3)
Chloride: 102 mmol/L (ref 98–111)
Creatinine, Ser: 0.62 mg/dL (ref 0.44–1.00)
GFR, Estimated: >60 mL/min (ref >60)
Glucose, Bld: 96 mg/dL (ref 70–99)
Potassium: 4 mmol/L (ref 3.5–5.1)
Sodium: 137 mmol/L (ref 135–145)
Total Bilirubin: 0.3 mg/dL (ref 0.3–1.2)
Total Protein: 7.2 g/dL (ref 6.5–8.1)

## 2022-09-11 LAB — URINALYSIS, ROUTINE W REFLEX MICROSCOPIC
Bilirubin Urine: NEGATIVE
Glucose, UA: NEGATIVE mg/dL
Hgb urine dipstick: NEGATIVE
Ketones, ur: NEGATIVE mg/dL
Leukocytes,Ua: NEGATIVE
Nitrite: NEGATIVE
Protein, ur: NEGATIVE mg/dL
Specific Gravity, Urine: 1.014 (ref 1.005–1.030)
pH: 7 (ref 5.0–8.0)

## 2022-09-11 LAB — WET PREP, GENITAL
Sperm: NONE SEEN
Trich, Wet Prep: NONE SEEN
WBC, Wet Prep HPF POC: <10 (ref <10)
Yeast Wet Prep HPF POC: NONE SEEN

## 2022-09-11 LAB — HCG, QUANTITATIVE, PREGNANCY: hCG, Beta Chain, Quant, S: <1 m[IU]/mL (ref <5)

## 2022-09-11 LAB — LACTIC ACID, PLASMA: Lactic Acid, Venous: 0.4 mmol/L — ABNORMAL LOW (ref 0.5–1.9)

## 2022-09-11 MED ORDER — SODIUM CHLORIDE 0.9 % IV SOLN
100.0000 mg | Freq: Two times a day (BID) | INTRAVENOUS | Status: DC
  Administered 2022-09-11 – 2022-09-13 (×5): 100 mg via INTRAVENOUS
  Filled 2022-09-11 (×7): qty 100

## 2022-09-11 MED ORDER — KETOROLAC TROMETHAMINE 30 MG/ML IJ SOLN
30.0000 mg | Freq: Four times a day (QID) | INTRAMUSCULAR | Status: AC
  Administered 2022-09-11 – 2022-09-12 (×4): 30 mg via INTRAVENOUS
  Filled 2022-09-11 (×4): qty 1

## 2022-09-11 MED ORDER — LACTATED RINGERS IV BOLUS
1000.0000 mL | Freq: Once | INTRAVENOUS | Status: AC
  Administered 2022-09-11: 1000 mL via INTRAVENOUS

## 2022-09-11 MED ORDER — METRONIDAZOLE 500 MG/100ML IV SOLN
500.0000 mg | Freq: Two times a day (BID) | INTRAVENOUS | Status: DC
  Administered 2022-09-12 – 2022-09-13 (×4): 500 mg via INTRAVENOUS
  Filled 2022-09-11 (×5): qty 100

## 2022-09-11 MED ORDER — FENTANYL CITRATE PF 50 MCG/ML IJ SOSY
50.0000 ug | PREFILLED_SYRINGE | Freq: Once | INTRAMUSCULAR | Status: AC
  Administered 2022-09-11: 50 ug via INTRAVENOUS
  Filled 2022-09-11: qty 1

## 2022-09-11 MED ORDER — SODIUM CHLORIDE 0.9 % IV SOLN
100.0000 mg | Freq: Once | INTRAVENOUS | Status: AC
  Administered 2022-09-11: 100 mg via INTRAVENOUS
  Filled 2022-09-11: qty 100

## 2022-09-11 MED ORDER — METRONIDAZOLE 500 MG/100ML IV SOLN
500.0000 mg | Freq: Two times a day (BID) | INTRAVENOUS | Status: DC
  Administered 2022-09-11: 500 mg via INTRAVENOUS
  Filled 2022-09-11 (×2): qty 100

## 2022-09-11 MED ORDER — ONDANSETRON HCL 4 MG/2ML IJ SOLN: 4.0000 mg | Freq: Four times a day (QID) | INTRAMUSCULAR | Status: DC | PRN

## 2022-09-11 MED ORDER — MORPHINE SULFATE (PF) 4 MG/ML IV SOLN
4.0000 mg | Freq: Once | INTRAVENOUS | Status: AC
  Administered 2022-09-11: 4 mg via INTRAVENOUS
  Filled 2022-09-11: qty 1

## 2022-09-11 MED ORDER — KETOROLAC TROMETHAMINE 30 MG/ML IJ SOLN
30.0000 mg | Freq: Once | INTRAMUSCULAR | Status: AC
  Administered 2022-09-11: 30 mg via INTRAVENOUS
  Filled 2022-09-11: qty 1

## 2022-09-11 MED ORDER — ONDANSETRON HCL 4 MG/2ML IJ SOLN
4.0000 mg | Freq: Once | INTRAMUSCULAR | Status: AC
  Administered 2022-09-11: 4 mg via INTRAVENOUS
  Filled 2022-09-11: qty 2

## 2022-09-11 MED ORDER — FENTANYL CITRATE (PF) 100 MCG/2ML IJ SOLN: 50.0000 ug | INTRAMUSCULAR | Status: DC | PRN

## 2022-09-11 MED ORDER — SODIUM CHLORIDE 0.9 % IV SOLN
1.0000 g | Freq: Once | INTRAVENOUS | Status: AC
  Administered 2022-09-11: 1 g via INTRAVENOUS
  Filled 2022-09-11: qty 10

## 2022-09-11 MED ORDER — SODIUM CHLORIDE 0.9 % IV SOLN
1.0000 g | INTRAVENOUS | Status: DC
  Administered 2022-09-11 – 2022-09-13 (×2): 1 g via INTRAVENOUS
  Filled 2022-09-11 (×4): qty 10

## 2022-09-11 MED ORDER — ACETAMINOPHEN 500 MG PO TABS
1000.0000 mg | ORAL_TABLET | Freq: Four times a day (QID) | ORAL | Status: DC
  Administered 2022-09-11 – 2022-09-13 (×8): 1000 mg via ORAL
  Filled 2022-09-11 (×8): qty 2

## 2022-09-11 MED ORDER — SODIUM CHLORIDE 0.9 % IV BOLUS
1000.0000 mL | Freq: Once | INTRAVENOUS | Status: AC
  Administered 2022-09-11: 1000 mL via INTRAVENOUS

## 2022-09-11 MED ORDER — LACTATED RINGERS IV SOLN
INTRAVENOUS | Status: DC
  Administered 2022-09-12: 125 mL/h via INTRAVENOUS

## 2022-09-11 MED ORDER — METRONIDAZOLE 500 MG/100ML IV SOLN
500.0000 mg | Freq: Once | INTRAVENOUS | Status: AC
  Administered 2022-09-11: 500 mg via INTRAVENOUS
  Filled 2022-09-11: qty 100

## 2022-09-11 MED ORDER — IOHEXOL 300 MG/ML  SOLN
100.0000 mL | Freq: Once | INTRAMUSCULAR | Status: AC | PRN
  Administered 2022-09-11: 80 mL via INTRAVENOUS

## 2022-09-11 MED ORDER — KETOROLAC TROMETHAMINE 30 MG/ML IJ SOLN: 30.0000 mg | Freq: Four times a day (QID) | INTRAMUSCULAR | Status: DC | PRN

## 2022-09-11 NOTE — ED Provider Notes (Addendum)
Patient reassessed.  Reported that her pain was improved but still having mild lower abdominal tenderness to palpation without rebound or guarding.  Blood pressures were soft so she was ordered another liter of fluid but suspect this is due to sleeping.  Did also send lactate which was WNL so do not feel the patient is in shock right now.  Due to prolonged wait times we discussed transfer to facility with OB/GYN available.  Was eventually accepted by Dr. Sheran Lawless to the MAU at [facility].   Fransico Meadow, MD 09/11/22 0940    Fransico Meadow, MD 09/11/22 [phone]

## 2022-09-11 NOTE — H&P (Addendum)
Evelyn Kerr is an 46 y.o. female para 3 who presented with right lower quadrant abdominal and pelvic pain that was intermittent for 1 week.  Pain began after an episode of sexual intercourse. The pain intensified yesterday and also had unexpected vaginal bleeding which made her go to the ED for further evaluation. A CT scan done showed tubo ovarian abscess and patient was admitted for further management.      Pertinent Gynecological History: Menses:  Irregular, every month to 2 months apart, this current bleeding is earlier than normal.   Bleeding: Light bleeding currently. Contraception: tubal ligation DES exposure: unknown Blood transfusions: none Sexually transmitted diseases: past history: Chlamydia Previous GYN Procedures:  None   Last mammogram: Unknown Last pap: normal Date: 2022 OB History: G3, P1203   Menstrual History: Patient's last menstrual period was 08/25/2022 (approximate).    Past Medical History:  Diagnosis Date   Depression    Kidney stones    Post-operative nausea and vomiting    Urinary tract infection    chronic     Past Surgical History:  Procedure Laterality Date   CESAREAN SECTION     LIPOSUCTION     with fat transfer   LITHOTRIPSY     TUBAL LIGATION      History reviewed. No pertinent family history.  Social History:  reports that she has never smoked. She has never used smokeless tobacco. She reports current alcohol use. She reports current drug use. Drug: Marijuana.  Allergies:  Allergies  Allergen Reactions   Other     Nausea/Vomiting with any opiate use    Medications Prior to Admission  Medication Sig Dispense Refill Last Dose   acidophilus (RISAQUAD) CAPS capsule Take 1 capsule by mouth daily.       Ascorbic Acid (VITAMIN C) 100 MG tablet Take 100 mg by mouth daily.      b complex vitamins tablet Take 1 tablet by mouth daily.      cetirizine (ZYRTEC) 10 MG tablet Take 10 mg by mouth daily.      cholecalciferol (VITAMIN D) 1000  units tablet Take 1,000 Units by mouth daily.      promethazine (PHENERGAN) 25 MG tablet Take 1 tablet (25 mg total) by mouth every 6 (six) hours as needed for nausea or vomiting. 10 tablet 0    VITAMIN K PO Take 1 tablet by mouth daily.       Review of Systems Constitutional: Denies current fevers/chills.  Had fevers and chills at home before admission.  Cardiovascular: Denies chest pain or palpitations Pulmonary: Denies coughing or wheezing Gastrointestinal: Denies nausea, vomiting or diarrhea Genitourinary: Denies unusual vaginal discharge, dysuria, urgency or frequency.  With pelvic pain.  With unusual vaginal bleeding.  Musculoskeletal: Denies muscle or joint aches and pain.  Neurology: Denies abnormal sensations such as tingling or numbness.    Blood pressure 109/71, pulse 65, temperature 98.3 F (36.8 C), temperature source Oral, resp. rate 15, height 5\' 7"  (1.702 m), weight 74.8 kg, last menstrual period 08/25/2022, SpO2 100 %. Physical Exam Constitutional: She is oriented to person, place, and time. She appears tired and sleepy.  HENT:  Head: Normocephalic and atraumatic.  Eyes: Conjunctivae and EOM are normal.  Neck: Normal range of motion.  Cardiovascular: Normal rate, regular rhythm, normal heart sounds.   Respiratory: Effort normal and breath sounds normal.  Abdomen: Soft, tender to palpation on right lower quadrant, no rebound, no guarding.  Peri pad: Small dark red blood on pad.  Genitourinary:  Exam done in ED, deferred for now.  Musculoskeletal: Normal range of motion.  Neurological: She is alert and oriented to person, place, and time.  Skin: Skin is warm and dry.  Psychiatric: She has a normal mood and affect. Judgment normal.    Results for orders placed or performed during the hospital encounter of 09/10/22 (from the past 24 hour(s))  CBC     Status: None   Collection Time: 09/10/22  7:57 PM  Result Value Ref Range   WBC 7.5 4.0 - 10.5 K/uL   RBC 3.89 3.87 -  5.11 MIL/uL   Hemoglobin 12.7 12.0 - 15.0 g/dL   HCT 62.3 76.2 - 83.1 %   MCV 94.9 80.0 - 100.0 fL   MCH 32.6 26.0 - 34.0 pg   MCHC 34.4 30.0 - 36.0 g/dL   RDW 51.7 61.6 - 07.3 %   Platelets 237 150 - 400 K/uL   nRBC 0.0 0.0 - 0.2 %  Urinalysis, Routine w reflex microscopic     Status: Abnormal   Collection Time: 09/10/22  7:57 PM  Result Value Ref Range   Color, Urine COLORLESS (A) YELLOW   APPearance CLEAR CLEAR   Specific Gravity, Urine 1.014 1.005 - 1.030   pH 7.0 5.0 - 8.0   Glucose, UA NEGATIVE NEGATIVE mg/dL   Hgb urine dipstick NEGATIVE NEGATIVE   Bilirubin Urine NEGATIVE NEGATIVE   Ketones, ur NEGATIVE NEGATIVE mg/dL   Protein, ur NEGATIVE NEGATIVE mg/dL   Nitrite NEGATIVE NEGATIVE   Leukocytes,Ua NEGATIVE NEGATIVE   RBC / HPF 0-5 0 - 5 RBC/hpf   WBC, UA 0-5 0 - 5 WBC/hpf   Squamous Epithelial / LPF 0-5 0 - 5   Mucus PRESENT   Comprehensive metabolic panel     Status: Abnormal   Collection Time: 09/10/22 11:33 PM  Result Value Ref Range   Sodium 137 135 - 145 mmol/L   Potassium 4.0 3.5 - 5.1 mmol/L   Chloride 102 98 - 111 mmol/L   CO2 26 22 - 32 mmol/L   Glucose, Bld 96 70 - 99 mg/dL   BUN 11 6 - 20 mg/dL   Creatinine, Ser 7.10 0.44 - 1.00 mg/dL   Calcium 9.5 8.9 - 62.6 mg/dL   Total Protein 7.2 6.5 - 8.1 g/dL   Albumin 4.3 3.5 - 5.0 g/dL   AST 10 (L) 15 - 41 U/L   ALT 11 0 - 44 U/L   Alkaline Phosphatase 45 38 - 126 U/L   Total Bilirubin 0.3 0.3 - 1.2 mg/dL   GFR, Estimated >94 >85 mL/min   Anion gap 9 5 - 15  hCG, quantitative, pregnancy     Status: None   Collection Time: 09/10/22 11:33 PM  Result Value Ref Range   hCG, Beta Chain, Quant, S <1 <5 mIU/mL  Wet prep, genital     Status: Abnormal   Collection Time: 09/11/22  2:06 AM   Specimen: PATH Cytology Cervicovaginal Ancillary Only  Result Value Ref Range   Yeast Wet Prep HPF POC NONE SEEN NONE SEEN   Trich, Wet Prep NONE SEEN NONE SEEN   Clue Cells Wet Prep HPF POC PRESENT (A) NONE SEEN   WBC,  Wet Prep HPF POC <10 <10   Sperm NONE SEEN   Lactic acid, plasma     Status: Abnormal   Collection Time: 09/11/22  7:40 AM  Result Value Ref Range   Lactic Acid, Venous 0.4 (L) 0.5 - 1.9 mmol/L    US  PELVIC COMPLETE WITH TRANSVAGINAL  Result Date: 09/11/2022 CLINICAL DATA:  CT findings today concerning for right tubo-ovarian abscess, left hydrosalpinx versus early TOA. [id number] with pelvic pain. [id number]. EXAM: TRANSABDOMINAL AND TRANSVAGINAL ULTRASOUND OF PELVIS DOPPLER ULTRASOUND OF OVARIES TECHNIQUE: Both transabdominal and transvaginal ultrasound examinations of the pelvis were performed. Transabdominal technique was performed for global imaging of the pelvis including uterus, ovaries, adnexal regions, and pelvic cul-de-sac. It was necessary to proceed with endovaginal exam following the transabdominal exam to visualize the ovaries and adnexal spaces better. Color and duplex Doppler ultrasound was utilized to evaluate blood flow to the ovaries. COMPARISON:  CT today, ultrasound and CT both 08/31/2016. FINDINGS: Uterus Measurements: Anteverted on the transabdominal study, retroverted on the endovaginal scan, measures 10.4 x 4.5 x 5.4 cm = volume: 132.5 mL. No fibroids or other mass visualized. The cervix however, does contain multiple small complex nabothian cysts, which was noted in 2017 as well. Endometrium Thickness: 15.0 mm.  No focal abnormality visualized. Right ovary Measurements: The technologist measured the right ovary and an adjacent structure which may be part of the tubal complex and overestimates the right ovarian size. The right ovary measures approximately 2.8 x 2.0 x 1.9 cm = volume: 5.5 mL. Normal appearance. There was no distinct sonographic correlate to the tubular fluid-filled enhancing adnexal abnormality on CT. This remains suspicious for a tubal abscess versus TOA. Left ovary Measurements: 2.7 x 2.6 x 3.5 cm = volume: 12.8 mL. Normal appearance/no adnexal mass. 2.8 cm uncomplicated  dominant follicle again noted. Tubular fluid-filled abnormality noted on CT is not seen on ultrasound. Color doppler evaluation of both ovaries demonstrates preservation of the bilateral color flow, and evidence left pelvic venous congestion. Other findings There is trace anechoic cul-de-sac free fluid. IMPRESSION: 1. Unable to redemonstrate the CT findings on ultrasound. Findings remain concerning for right tubal or tubo-ovarian abscess and left tubal hydrosalpinx or early TOA. 2. No evidence of ovarian torsion. 3. Multiple small complex nabothian cysts, similar sonographic appearance to 2017. 4. Left pelvic venous congestion. Electronically Signed   By: Almira Bar M.D.   On: 09/11/2022 06:12   CT ABDOMEN PELVIS W CONTRAST  Result Date: 09/11/2022 CLINICAL DATA:  Right lower quadrant pain, cramping and vaginal spotting. EXAM: CT ABDOMEN AND PELVIS WITH CONTRAST TECHNIQUE: Multidetector CT imaging of the abdomen and pelvis was performed using the standard protocol following bolus administration of intravenous contrast. RADIATION DOSE REDUCTION: This exam was performed according to the departmental dose-optimization program which includes automated exposure control, adjustment of the mA and/or kV according to patient size and/or use of iterative reconstruction technique. CONTRAST:  72mL OMNIPAQUE IOHEXOL 300 MG/ML  SOLN COMPARISON:  Study of 08/31/2016 with contrast FINDINGS: Lower chest: No abnormality. Hepatobiliary: 19.5 cm length liver with slight steatosis. No hepatic mass enhancement. The gallbladder and bile ducts are unremarkable. Pancreas: No focal abnormality. Spleen: No focal abnormality.  No splenomegaly. Adrenals/Urinary Tract: There is no adrenal mass. There is homogeneous enhancement in the bilateral renal cortex. There is a 3 mm nonobstructive caliceal stone in the superior pole left kidney are again noted. There are no ureteral stone or hydronephrosis. There is no bladder thickening.  Stomach/Bowel: No dilatation or wall thickening including the appendix. Scattered diverticulosis of the left-sided colon noted without diverticulitis. Vascular/Lymphatic: No significant vascular findings are present. No enlarged abdominal or pelvic lymph nodes. There are shotty subcentimeter up to borderline size retroperitoneal nodes but these are unchanged in the interval. No inguinal adenopathy. Reproductive: The uterus is  anteverted, unremarkable. There is a tampon in the vagina. Tubular fluid distended enhancing structure with inflammatory stranding is noted in the right periuterine area. Tubular enhancing, less fluid-filled structure in the left periuterine area is noted and does not show inflammatory stranding. There is a 2.8 cm thinly septated left ovarian cyst measuring 7.3 Hounsfield units. No ovarian cyst is seen on the right. Other: There is a tiny umbilical fat hernia and small left inguinal fat hernia. Subcutaneous linear scarring in the abdominal wall is noted consistent with prior abdominoplasty. There is trace low-density fluid in the pelvic cul-de-sac, greater previously, could be reactive or physiologic. There is no free hemorrhage, free air, or other free fluid. Musculoskeletal: There is a left paracentral disc bulge at L5-S1 with mild mass effect on the left L5 nerve root. No acute or significant osseous findings. IMPRESSION: 1. Findings of concern for right tubo-ovarian abscess. 2. Enhancing less fluid-filled left fallopian tube could be hydrosalpinx or early TOA. 3. 2.8 cm thinly septated and otherwise uncomplicated left ovarian cyst. 4. Slight hepatic steatosis. 5. 3 mm nonobstructive left upper pole renal caliceal stone. 6. L5-S1 left paracentral disc bulge with mild mass effect suspected on the left L5 nerve root. 7. Remaining findings described above. Electronically Signed   By: Telford Nab M.D.   On: 09/11/2022 01:22    Assessment/Plan: 46 y/o Para 3 with a history of bilateral  tubal ligation who presented with right lower quadrant abdominal and pelvic pain, with tuboovarian abscess on imaging.  - Admit to [facility] - Antibiotics started: IV Ceftriaxone, IV metronidazole and IV doxycycline. - Pain medication: IV Toradol, oral tylenol and IV fentanyl as needed - Antiemetics as needed - Recheck CBC in AM. - Follow up GC/chlamydia test results.    Archie Endo, MD 09/11/2022, 6:54 PM

## 2022-09-11 NOTE — ED Notes (Signed)
Pt ambulated "gingerly" to the bathroom. 

## 2022-09-11 NOTE — ED Notes (Signed)
[facility] called for hospitalist consult  

## 2022-09-11 NOTE — Plan of Care (Signed)
[facility] will assume care on arrival to accepting facility. Until arrival, care as per EDP. However, [facility] available 24/7 for questions and assistance.  Nursing staff, please page [facility] Admits and Consults ([phone]) as soon as the patient arrives to the hospital.   

## 2022-09-12 LAB — CBC WITH DIFFERENTIAL/PLATELET
Abs Immature Granulocytes: 0.02 10*3/uL (ref 0.00–0.07)
Basophils Absolute: 0 10*3/uL (ref 0.0–0.1)
Basophils Relative: 0 %
Eosinophils Absolute: 0.1 10*3/uL (ref 0.0–0.5)
Eosinophils Relative: 3 %
HCT: 32.9 % — ABNORMAL LOW (ref 36.0–46.0)
Hemoglobin: 11.2 g/dL — ABNORMAL LOW (ref 12.0–15.0)
Immature Granulocytes: 0 %
Lymphocytes Relative: 28 %
Lymphs Abs: 1.5 10*3/uL (ref 0.7–4.0)
MCH: 32.9 pg (ref 26.0–34.0)
MCHC: 34 g/dL (ref 30.0–36.0)
MCV: 96.8 fL (ref 80.0–100.0)
Monocytes Absolute: 0.4 10*3/uL (ref 0.1–1.0)
Monocytes Relative: 8 %
Neutro Abs: 3.2 10*3/uL (ref 1.7–7.7)
Neutrophils Relative %: 61 %
Platelets: 194 10*3/uL (ref 150–400)
RBC: 3.4 MIL/uL — ABNORMAL LOW (ref 3.87–5.11)
RDW: 12.2 % (ref 11.5–15.5)
WBC: 5.2 10*3/uL (ref 4.0–10.5)
nRBC: 0 % (ref 0.0–0.2)

## 2022-09-12 LAB — GC/CHLAMYDIA PROBE AMP (~~LOC~~) NOT AT ARMC
Chlamydia: NEGATIVE
Comment: NEGATIVE
Comment: NORMAL
Neisseria Gonorrhea: NEGATIVE

## 2022-09-12 MED ORDER — KETOROLAC TROMETHAMINE 10 MG PO TABS
10.0000 mg | ORAL_TABLET | Freq: Four times a day (QID) | ORAL | Status: DC | PRN
  Administered 2022-09-12 – 2022-09-13 (×2): 10 mg via ORAL
  Filled 2022-09-12 (×3): qty 1

## 2022-09-12 MED ORDER — IBUPROFEN 600 MG PO TABS
600.0000 mg | ORAL_TABLET | Freq: Four times a day (QID) | ORAL | Status: DC | PRN
  Administered 2022-09-12: 600 mg via ORAL
  Filled 2022-09-12: qty 1

## 2022-09-12 MED ORDER — GABAPENTIN 300 MG PO CAPS: 300.0000 mg | ORAL_CAPSULE | Freq: Two times a day (BID) | ORAL | Status: DC | PRN

## 2022-09-12 MED ORDER — LORATADINE 10 MG PO TABS
10.0000 mg | ORAL_TABLET | Freq: Every day | ORAL | Status: DC
  Administered 2022-09-12 – 2022-09-13 (×2): 10 mg via ORAL
  Filled 2022-09-12 (×2): qty 1

## 2022-09-12 MED ORDER — DOCUSATE SODIUM 100 MG PO CAPS
100.0000 mg | ORAL_CAPSULE | Freq: Two times a day (BID) | ORAL | Status: DC
  Administered 2022-09-12 – 2022-09-13 (×2): 100 mg via ORAL
  Filled 2022-09-12 (×2): qty 1

## 2022-09-12 NOTE — Progress Notes (Signed)
Subjective: Patient reports incisional pain, tolerating PO, + flatus, + BM and no problems voiding.    Objective: I have reviewed patient's vital signs, intake and output, medications, labs, microbiology, pathology and radiology results.  General: alert Resp: clear to auscultation bilaterally Cardio: regular rate and rhythm, S1, S2 normal, no murmur, click, rub or gallop GI: soft, non-tender; bowel sounds normal; no masses,  no organomegaly Extremities: extremities normal, atraumatic, no cyanosis or edema Vaginal Bleeding: minimal   Assessment/Plan: Pelvic pain and CT suggestive of TOA Cxs neg\ Continue abx.   Possible DC in am  LOS: 1 day    Kana Reimann A Wilba Mutz 09/12/2022, 3:02 PM

## 2022-09-13 ENCOUNTER — Other Ambulatory Visit (HOSPITAL_COMMUNITY): Payer: Self-pay

## 2022-09-13 DIAGNOSIS — N760 Acute vaginitis: Secondary | ICD-10-CM

## 2022-09-13 DIAGNOSIS — B9689 Other specified bacterial agents as the cause of diseases classified elsewhere: Secondary | ICD-10-CM

## 2022-09-13 MED ORDER — METRONIDAZOLE 500 MG PO TABS: 500.0000 mg | ORAL_TABLET | Freq: Two times a day (BID) | ORAL | 0 refills | Status: DC

## 2022-09-13 MED ORDER — KETOROLAC TROMETHAMINE 10 MG PO TABS: 10.0000 mg | ORAL_TABLET | Freq: Four times a day (QID) | ORAL | 0 refills | Status: AC | PRN

## 2022-09-13 MED ORDER — DOXYCYCLINE MONOHYDRATE 100 MG PO TABS: 100.0000 mg | ORAL_TABLET | Freq: Two times a day (BID) | ORAL | 0 refills | Status: AC

## 2022-09-13 MED ORDER — METRONIDAZOLE 500 MG PO TABS: 500.0000 mg | ORAL_TABLET | Freq: Two times a day (BID) | ORAL | 0 refills | Status: AC

## 2022-09-13 NOTE — Discharge Summary (Addendum)
GYN Discharge Summary     Patient Name: Evelyn Kerr DOB: [birth year] MRN: [id number]  Date of admission: 09/10/2022  Date of discharge: 09/13/2022  Admitting diagnosis: Tubo-ovarian abscess [N70.93] TOA (tubo-ovarian abscess) [N70.93]    Secondary diagnosis:  Principal Problem:   Tubo-ovarian abscess Active Problems:   Bacterial vaginosis  Additional problems: None     Discharge diagnosis: Bacterial vaginosis                                   Hospital course:  admitted and received IV Ceftriaxone, IV metronidazole and IV doxycycline for suspected TOA. Pain controlled with Toradol. She has remained afebrile during admission and wbc wnl. GC/CT negative. Wet prep positive for BV.   Physical exam  Vitals:   09/12/22 1952 09/12/22 2257 09/13/22 0427 09/13/22 1127  BP: 117/80 112/75 97/62 113/73  Pulse: 64 75 69 76  Resp: 18 18 18 18   Temp: 98 F (36.7 C) 97.9 F (36.6 C) 98 F (36.7 C) 98.3 F (36.8 C)  TempSrc: Oral Oral Oral Oral  SpO2: 100% 100% 100% 98%  Weight:      Height:       Physical: General: alert Resp: clear to auscultation bilaterally Cardio: regular rate and rhythm, S1, S2 normal, no murmur, click, rub or gallop GI: soft, mild TTP in RLQ, no rebound, no guarding, no masses,  no organomegaly Extremities: extremities normal, atraumatic, no cyanosis or edema Vaginal Bleeding: minimal  Labs: Lab Results  Component Value Date   WBC 5.2 09/12/2022   HGB 11.2 (L) 09/12/2022   HCT 32.9 (L) 09/12/2022   MCV 96.8 09/12/2022   PLT 194 09/12/2022      Latest Ref Rng & Units 09/10/2022   11:33 PM  CMP  Glucose 70 - 99 mg/dL 96   BUN 6 - 20 mg/dL 11   Creatinine 0.44 - 1.00 mg/dL 0.62   Sodium 135 - 145 mmol/L 137   Potassium 3.5 - 5.1 mmol/L 4.0   Chloride 98 - 111 mmol/L 102   CO2 22 - 32 mmol/L 26   Calcium 8.9 - 10.3 mg/dL 9.5   Total Protein 6.5 - 8.1 g/dL 7.2   Total Bilirubin 0.3 - 1.2 mg/dL 0.3   Alkaline Phos 38 - 126 U/L 45   AST  15 - 41 U/L 10   ALT 0 - 44 U/L 11     Discharge instruction: Rx for flagyl 500mg  BID x 5 days to continue treatment for BV. F/u with Dr. Alesia Richards in 1-2 weeks  After visit meds:  Allergies as of 09/13/2022       Reactions   Other    Nausea/Vomiting with any opiate use        Medication List     TAKE these medications    acidophilus Caps capsule Take 1 capsule by mouth daily.   b complex vitamins tablet Take 1 tablet by mouth daily.   cetirizine 10 MG tablet Commonly known as: ZYRTEC Take 10 mg by mouth daily.   cholecalciferol 1000 units tablet Commonly known as: VITAMIN D Take 1,000 Units by mouth daily.   doxycycline 100 MG tablet Commonly known as: ADOXA Take 1 tablet (100 mg total) by mouth 2 (two) times daily for 12 days.   ketorolac 10 MG tablet Commonly known as: TORADOL Take 1 tablet (10 mg total) by mouth every 6 (six) hours as needed for up  to 5 days for moderate pain.   metroNIDAZOLE 500 MG tablet Commonly known as: Flagyl Take 1 tablet (500 mg total) by mouth 2 (two) times daily for 12 days.   promethazine 25 MG tablet Commonly known as: PHENERGAN Take 1 tablet (25 mg total) by mouth every 6 (six) hours as needed for nausea or vomiting.   vitamin C 100 MG tablet Take 100 mg by mouth daily.   VITAMIN K PO Take 1 tablet by mouth daily.        Diet: routine diet  Activity: Advance as tolerated. Pelvic rest for 2 weeks.   Outpatient follow up:2 weeks Follow up Appt:No future appointments. Follow up Visit:No follow-ups on file.  Disposition: home   09/13/2022 Josefine Class, MD

## 2022-09-13 NOTE — Progress Notes (Addendum)
Subjective: Patient doing well. She has remained afebrile during admission and wbc wnl. She states pain is improved with Toradol. GC/CT negative. Wet prep positive for BV.   Objective: Vitals:   09/13/22 0427 09/13/22 1127  BP: 97/62 113/73  Pulse: 69 76  Resp: 18 18  Temp: 98 F (36.7 C) 98.3 F (36.8 C)  SpO2: 100% 98%     General: alert Resp: clear to auscultation bilaterally Cardio: regular rate and rhythm, S1, S2 normal, no murmur, click, rub or gallop GI: soft, mild TTP in RLQ, no rebound, no guarding, no masses,  no organomegaly Extremities: extremities normal, atraumatic, no cyanosis or edema Vaginal Bleeding: minimal   Assessment/Plan: Pelvic pain and CT suggestive of TOA Bacterial vaginosis  GC/CT neg Continue abx Stable for discharge to home, pt. Will not have a ride home until 6pm so will d/c at that time.  Rx for flagyl 500mg  BID x 5 days to continue treatment for BV. F/u with Dr. Alesia Richards in 2 weeks   LOS: 2 days    Evelyn Kerr Class 09/13/2022, 12:48 PM

## 2022-09-13 NOTE — Care Management (Signed)
CM spoke to patient and asked patient if she would be able to pay for medications. She shared with CM that she and or her partner will pay for medications at outside pharmacy when they pick them up and can pay for them. No transportation barriers.  Evelyn Kerr RNC-MNN, BSN Transitions of Care [facility] [place]
